# Patient Record
Sex: Female | Born: 1955
Health system: Southern US, Community
[De-identification: ages and names within clinical notes are randomized; demographics above are authoritative.]

## PROBLEM LIST (undated history)

## (undated) DIAGNOSIS — C50919 Malignant neoplasm of unspecified site of unspecified female breast: Secondary | ICD-10-CM

---

## 1999-10-19 ENCOUNTER — Other Ambulatory Visit: Admission: RE | Admit: 1999-10-19 | Discharge: 1999-10-19 | Payer: Self-pay | Admitting: Obstetrics and Gynecology

## 1999-11-27 ENCOUNTER — Other Ambulatory Visit: Admission: RE | Admit: 1999-11-27 | Discharge: 1999-11-27 | Payer: Self-pay | Admitting: Obstetrics and Gynecology

## 1999-11-27 ENCOUNTER — Encounter (INDEPENDENT_AMBULATORY_CARE_PROVIDER_SITE_OTHER): Payer: Self-pay | Admitting: Specialist

## 2000-08-08 ENCOUNTER — Encounter: Payer: Self-pay | Admitting: Gastroenterology

## 2000-08-08 ENCOUNTER — Encounter: Admission: RE | Admit: 2000-08-08 | Discharge: 2000-08-08 | Payer: Self-pay | Admitting: *Deleted

## 2000-10-08 ENCOUNTER — Encounter: Payer: Self-pay | Admitting: Obstetrics and Gynecology

## 2000-10-08 ENCOUNTER — Encounter: Admission: RE | Admit: 2000-10-08 | Discharge: 2000-10-08 | Payer: Self-pay | Admitting: Obstetrics and Gynecology

## 2000-11-04 ENCOUNTER — Other Ambulatory Visit: Admission: RE | Admit: 2000-11-04 | Discharge: 2000-11-04 | Payer: Self-pay | Admitting: Obstetrics and Gynecology

## 2000-12-06 ENCOUNTER — Encounter: Payer: Self-pay | Admitting: Family Medicine

## 2000-12-06 ENCOUNTER — Encounter: Admission: RE | Admit: 2000-12-06 | Discharge: 2000-12-06 | Payer: Self-pay | Admitting: Family Medicine

## 2000-12-25 ENCOUNTER — Ambulatory Visit (HOSPITAL_BASED_OUTPATIENT_CLINIC_OR_DEPARTMENT_OTHER): Admission: RE | Admit: 2000-12-25 | Discharge: 2000-12-25 | Payer: Self-pay | Admitting: Surgery

## 2002-03-23 ENCOUNTER — Encounter: Admission: RE | Admit: 2002-03-23 | Discharge: 2002-03-23 | Payer: Self-pay | Admitting: Obstetrics and Gynecology

## 2002-03-23 ENCOUNTER — Encounter: Payer: Self-pay | Admitting: Obstetrics and Gynecology

## 2002-06-15 ENCOUNTER — Other Ambulatory Visit: Admission: RE | Admit: 2002-06-15 | Discharge: 2002-06-15 | Payer: Self-pay | Admitting: Obstetrics and Gynecology

## 2003-08-25 ENCOUNTER — Other Ambulatory Visit: Admission: RE | Admit: 2003-08-25 | Discharge: 2003-08-25 | Payer: Self-pay | Admitting: Obstetrics and Gynecology

## 2003-12-08 ENCOUNTER — Encounter: Admission: RE | Admit: 2003-12-08 | Discharge: 2003-12-08 | Payer: Self-pay | Admitting: Obstetrics and Gynecology

## 2004-09-27 ENCOUNTER — Other Ambulatory Visit: Admission: RE | Admit: 2004-09-27 | Discharge: 2004-09-27 | Payer: Self-pay | Admitting: Obstetrics and Gynecology

## 2004-12-29 ENCOUNTER — Encounter: Admission: RE | Admit: 2004-12-29 | Discharge: 2004-12-29 | Payer: Self-pay | Admitting: Obstetrics and Gynecology

## 2005-10-15 ENCOUNTER — Other Ambulatory Visit: Admission: RE | Admit: 2005-10-15 | Discharge: 2005-10-15 | Payer: Self-pay | Admitting: Obstetrics and Gynecology

## 2005-12-31 ENCOUNTER — Encounter: Admission: RE | Admit: 2005-12-31 | Discharge: 2005-12-31 | Payer: Self-pay | Admitting: Obstetrics and Gynecology

## 2007-07-04 ENCOUNTER — Encounter: Admission: RE | Admit: 2007-07-04 | Discharge: 2007-07-04 | Payer: Self-pay | Admitting: Obstetrics and Gynecology

## 2007-08-19 ENCOUNTER — Other Ambulatory Visit: Admission: RE | Admit: 2007-08-19 | Discharge: 2007-08-19 | Payer: Self-pay | Admitting: Obstetrics and Gynecology

## 2008-07-13 ENCOUNTER — Encounter: Admission: RE | Admit: 2008-07-13 | Discharge: 2008-07-13 | Payer: Self-pay | Admitting: Obstetrics and Gynecology

## 2008-08-20 ENCOUNTER — Other Ambulatory Visit: Admission: RE | Admit: 2008-08-20 | Discharge: 2008-08-20 | Payer: Self-pay | Admitting: Obstetrics and Gynecology

## 2008-09-17 ENCOUNTER — Encounter: Admission: RE | Admit: 2008-09-17 | Discharge: 2008-09-17 | Payer: Self-pay | Admitting: Family Medicine

## 2008-10-15 ENCOUNTER — Encounter: Admission: RE | Admit: 2008-10-15 | Discharge: 2008-10-15 | Payer: Self-pay | Admitting: General Surgery

## 2009-08-02 ENCOUNTER — Encounter: Admission: RE | Admit: 2009-08-02 | Discharge: 2009-08-02 | Payer: Self-pay | Admitting: Obstetrics and Gynecology

## 2009-10-04 ENCOUNTER — Other Ambulatory Visit: Admission: RE | Admit: 2009-10-04 | Discharge: 2009-10-04 | Payer: Self-pay | Admitting: Obstetrics and Gynecology

## 2010-08-08 ENCOUNTER — Encounter: Admission: RE | Admit: 2010-08-08 | Discharge: 2010-08-08 | Payer: Self-pay | Admitting: Obstetrics and Gynecology

## 2010-10-31 ENCOUNTER — Other Ambulatory Visit
Admission: RE | Admit: 2010-10-31 | Discharge: 2010-10-31 | Payer: Self-pay | Source: Home / Self Care | Admitting: Obstetrics and Gynecology

## 2010-12-10 ENCOUNTER — Encounter: Payer: Self-pay | Admitting: Obstetrics and Gynecology

## 2011-04-06 NOTE — Op Note (Signed)
[place]. [facility]  Patient:    ADRINNE, Sarah Pace                         MRN: [id number] Proc. Date: 12/25/00 Adm. Date:  [id number] Attending:  Charlton Haws CC:         Chales Salmon. Abigail Miyamoto, M.D.  Charolett Bumpers III, M.D.   Operative Report  CCS# [id number]  PREOPERATIVE DIAGNOSIS:  Epigastric hernia.  POSTOPERATIVE DIAGNOSIS:  Epigastric hernia.  OPERATION PERFORMED:  SURGEON:  Currie Paris, M.D.  ANESTHESIA:  INDICATIONS FOR PROCEDURE:  The patient is a 55 year old who has had a symptomatic epigastric hernia and came in for repair.  DESCRIPTION OF PROCEDURE:  The patient was brought to the operating room and after satisfactory IV sedation, was prepped and draped.  The exact spot of the hernia had been marked with the patient standing up while she was still in the holding area before coming to the operating room.  A combination of 1% Xylocaine with epinephrine and 0.5% plain Marcaine was mixed equally and used as a local and the area infiltrated.  A skin incision was made and subcutaneous tissues divided until I encountered some fatty tissue that was fairly separate from the subcutaneous fat and cleaning this off was able to identify that this was epigastric fat protruding through a small fascial defect.  Once I had the fascia cleaned off in all directions around the defect I was able to readily reduce the fatty tissue and the defect itself was under 1 cm in size.  With it reduced, I held it up with two Kocher clamps on each corner and placed a small mesh plug into the defect and closed the defect with two sutures with 0 Prolene.  A circular piece of mesh was then overlaid as an onlay graft and sutured down with sutures of 0 Prolene.  The incision appeared to be dry.  The wound was closed with some 3-0 Vicryl followed by 4-0 Monocryl subcuticular plus Steri-Strips.  The patient tolerated the procedure well. There are no operative  complications.  All counts were correct. DD:  12/25/00 TD:  12/26/00 Job: [id number]

## 2012-06-12 ENCOUNTER — Other Ambulatory Visit: Payer: Self-pay | Admitting: Obstetrics and Gynecology

## 2012-06-12 DIAGNOSIS — Z1231 Encounter for screening mammogram for malignant neoplasm of breast: Secondary | ICD-10-CM

## 2012-06-19 ENCOUNTER — Ambulatory Visit: Payer: Self-pay

## 2012-06-24 ENCOUNTER — Ambulatory Visit
Admission: RE | Admit: 2012-06-24 | Discharge: 2012-06-24 | Disposition: A | Discharge status: Home / Self Care | Payer: 59 | Source: Ambulatory Visit | Attending: Obstetrics and Gynecology | Admitting: Obstetrics and Gynecology

## 2012-06-24 DIAGNOSIS — Z1231 Encounter for screening mammogram for malignant neoplasm of breast: Secondary | ICD-10-CM

## 2012-07-04 ENCOUNTER — Other Ambulatory Visit: Payer: Self-pay | Admitting: Obstetrics and Gynecology

## 2012-07-04 ENCOUNTER — Other Ambulatory Visit (HOSPITAL_COMMUNITY)
Admission: RE | Admit: 2012-07-04 | Discharge: 2012-07-04 | Disposition: A | Discharge status: Home / Self Care | Payer: 59 | Source: Ambulatory Visit | Attending: Obstetrics and Gynecology | Admitting: Obstetrics and Gynecology

## 2012-07-04 DIAGNOSIS — Z01419 Encounter for gynecological examination (general) (routine) without abnormal findings: Secondary | ICD-10-CM | POA: Insufficient documentation

## 2014-03-22 ENCOUNTER — Other Ambulatory Visit: Payer: Self-pay

## 2014-03-22 DIAGNOSIS — Z1231 Encounter for screening mammogram for malignant neoplasm of breast: Secondary | ICD-10-CM

## 2014-03-29 ENCOUNTER — Encounter (INDEPENDENT_AMBULATORY_CARE_PROVIDER_SITE_OTHER): Payer: Self-pay

## 2014-03-29 ENCOUNTER — Ambulatory Visit
Admission: RE | Admit: 2014-03-29 | Discharge: 2014-03-29 | Disposition: A | Discharge status: Home / Self Care | Payer: No Typology Code available for payment source | Source: Ambulatory Visit

## 2014-03-29 DIAGNOSIS — Z1231 Encounter for screening mammogram for malignant neoplasm of breast: Secondary | ICD-10-CM

## 2014-04-06 ENCOUNTER — Other Ambulatory Visit: Payer: Self-pay | Admitting: Obstetrics and Gynecology

## 2014-04-06 ENCOUNTER — Other Ambulatory Visit (HOSPITAL_COMMUNITY)
Admission: RE | Admit: 2014-04-06 | Discharge: 2014-04-06 | Disposition: A | Discharge status: Home / Self Care | Payer: No Typology Code available for payment source | Source: Ambulatory Visit | Attending: Obstetrics and Gynecology | Admitting: Obstetrics and Gynecology

## 2014-04-06 DIAGNOSIS — Z01419 Encounter for gynecological examination (general) (routine) without abnormal findings: Secondary | ICD-10-CM | POA: Insufficient documentation

## 2014-04-06 DIAGNOSIS — Z1151 Encounter for screening for human papillomavirus (HPV): Secondary | ICD-10-CM | POA: Insufficient documentation

## 2015-05-17 ENCOUNTER — Ambulatory Visit (INDEPENDENT_AMBULATORY_CARE_PROVIDER_SITE_OTHER): Payer: No Typology Code available for payment source | Admitting: Podiatry

## 2015-05-17 ENCOUNTER — Ambulatory Visit (INDEPENDENT_AMBULATORY_CARE_PROVIDER_SITE_OTHER): Payer: No Typology Code available for payment source

## 2015-05-17 DIAGNOSIS — M722 Plantar fascial fibromatosis: Secondary | ICD-10-CM | POA: Diagnosis not present

## 2015-05-17 DIAGNOSIS — M779 Enthesopathy, unspecified: Secondary | ICD-10-CM

## 2015-05-17 DIAGNOSIS — R52 Pain, unspecified: Secondary | ICD-10-CM

## 2015-05-17 MED ORDER — DICLOFENAC SODIUM 75 MG PO TBEC: 75.0000 mg | DELAYED_RELEASE_TABLET | Freq: Two times a day (BID) | ORAL | Status: DC

## 2015-05-17 MED ORDER — TRIAMCINOLONE ACETONIDE 10 MG/ML IJ SUSP
10.0000 mg | Freq: Once | INTRAMUSCULAR | Status: AC
  Administered 2015-05-17: 10 mg

## 2015-05-17 NOTE — Patient Instructions (Signed)

## 2015-05-17 NOTE — Progress Notes (Signed)
   Subjective:    Patient ID: DAESIA ZYLKA, female    DOB: 1956-04-24, 59 y.o.   MRN: [id number]  HPI Patient presents heel and ankle pain in L foot. Originally started 4 years ago but has significantly worsened over last couple weeks. Pain in the heel and arch of her foot seems indicative of plantar fasciitis. Ankle area is swollen and patient says she may have bone spurs. Patient also has pain on outside of foot when she walks.   Review of Systems  Constitutional: Negative.   HENT: Negative.   Eyes: Positive for visual disturbance.  Respiratory: Negative.   Cardiovascular: Negative.   Gastrointestinal: Negative.   Endocrine: Negative.   Genitourinary: Negative.   Musculoskeletal: Negative.   Skin: Negative.   Allergic/Immunologic: Negative.   Neurological: Negative.   Hematological: Negative.   Psychiatric/Behavioral: Negative.        Objective:   Physical Exam        Assessment & Plan:

## 2015-05-17 NOTE — Progress Notes (Signed)
Subjective:     Patient ID: Sarah Pace, female   DOB: October 05, 1956, 59 y.o.   MRN: [id number]  HPI patient states my left heel has been killing me and I have had trouble with my ankle also that I had treated several years ago and it's been okay but we'll still bother me at times   Review of Systems  All other systems reviewed and are negative.      Objective:   Physical Exam  Constitutional: She is oriented to person, place, and time.  Cardiovascular: Intact distal pulses.   Musculoskeletal: Normal range of motion.  Neurological: She is oriented to person, place, and time.  Skin: Skin is warm.  Nursing note and vitals reviewed.  neurovascular status intact muscle strength adequate range of motion within normal limits. Patient's noted to have moderate discomfort in the posterior tibial tendon left at the insertion to the navicular but the tendon is strong and is functioning well and has quite a bit of pain in the plantar fascia at the insertion of the tendon into the calcaneus. Digits are well-perfused patient well oriented 3     Assessment:     Acute plantar fasciitis left with moderate chronic posterior tibial tendinitis left    Plan:     H&P and x-rays reviewed. Patient will require at one point Orthotics as her orthotics or 59 years old but at this point were focusing on the acute plantar fasciitis and I injected the plantar fascia 3 mg Kenalog 5 mg Xylocaine and applied fascial brace with instructions on usage

## 2015-05-24 ENCOUNTER — Encounter: Payer: Self-pay | Admitting: Podiatry

## 2015-05-24 ENCOUNTER — Ambulatory Visit (INDEPENDENT_AMBULATORY_CARE_PROVIDER_SITE_OTHER): Payer: No Typology Code available for payment source | Admitting: Podiatry

## 2015-05-24 VITALS — BP 115/69 | HR 64 | Resp 12

## 2015-05-24 DIAGNOSIS — M25472 Effusion, left ankle: Secondary | ICD-10-CM | POA: Diagnosis not present

## 2015-05-24 DIAGNOSIS — M722 Plantar fascial fibromatosis: Secondary | ICD-10-CM

## 2015-05-25 ENCOUNTER — Ambulatory Visit: Payer: No Typology Code available for payment source | Admitting: Podiatry

## 2015-05-25 NOTE — Progress Notes (Signed)
Subjective:     Patient ID: Sarah Pace, female   DOB: Jan 18, 1956, 59 y.o.   MRN: [id number]  HPI patient states my heel is getting quite a bit better but still sore at the end of the day and I know I need new orthotics as they are no longer holding my arch up properly   Review of Systems     Objective:   Physical Exam Neurovascular status intact with significant diminishment of discomfort plantar aspect left heel at the insertional point tendon the calcaneus with moderate depression of the arch noted upon weightbearing    Assessment:     Plantar fasciitis still present left with improvement    Plan:     Reviewed continued physical therapy anti-inflammatory and supportive shoe gear usage. Scanned for new orthotics to replace 59-year-old orthotics and explained activities that I would like her to do over this next several weeks.

## 2015-06-15 ENCOUNTER — Ambulatory Visit: Payer: No Typology Code available for payment source | Admitting: *Deleted

## 2015-06-15 DIAGNOSIS — M722 Plantar fascial fibromatosis: Secondary | ICD-10-CM

## 2015-06-15 NOTE — Progress Notes (Signed)
Patient ID: Sarah Pace, female   DOB: [birth year], 59 y.o.   MRN: [id number] Patient presents for orthotic pick up.  Verbal and written break in and wear instructions given.  Patient will follow up in 4 weeks if symptoms worsen or fail to improve.

## 2015-06-15 NOTE — Patient Instructions (Signed)

## 2015-06-22 ENCOUNTER — Ambulatory Visit (INDEPENDENT_AMBULATORY_CARE_PROVIDER_SITE_OTHER): Payer: No Typology Code available for payment source | Admitting: Podiatry

## 2015-06-22 ENCOUNTER — Encounter: Payer: Self-pay | Admitting: Podiatry

## 2015-06-22 VITALS — BP 130/80 | HR 58 | Resp 15

## 2015-06-22 DIAGNOSIS — M722 Plantar fascial fibromatosis: Secondary | ICD-10-CM | POA: Diagnosis not present

## 2015-06-22 MED ORDER — TRIAMCINOLONE ACETONIDE 10 MG/ML IJ SUSP
10.0000 mg | Freq: Once | INTRAMUSCULAR | Status: AC
  Administered 2015-06-22: 10 mg

## 2015-06-23 NOTE — Progress Notes (Signed)
Subjective:     Patient ID: Sarah Pace, female   DOB: Jun 26, 1956, 59 y.o.   MRN: [id number]  HPI patient states the inside of her heel seems to be improving but there's been discomfort in the outside of the heel and center that's moderate in intensity   Review of Systems     Objective:   Physical Exam Neurovascular status intact muscle strength was adequate with discomfort that is more in the lateral and central band of the plantar fascia with improvement of a significant nature in the medial band of the plantar fascia    Assessment:     Plantar fasciitis left lateral band moderate into the central band    Plan:     Reviewed condition and at this time did a careful lateral injection 3 mg Kenalog 5 mg Xylocaine and instructed on physical therapy and continued orthotic usage and reappoint as needed

## 2016-05-28 ENCOUNTER — Other Ambulatory Visit: Payer: Self-pay | Admitting: Obstetrics and Gynecology

## 2016-05-28 DIAGNOSIS — Z1231 Encounter for screening mammogram for malignant neoplasm of breast: Secondary | ICD-10-CM

## 2016-06-01 ENCOUNTER — Ambulatory Visit
Admission: RE | Admit: 2016-06-01 | Discharge: 2016-06-01 | Disposition: A | Discharge status: Home / Self Care | Payer: 59 | Source: Ambulatory Visit | Attending: Obstetrics and Gynecology | Admitting: Obstetrics and Gynecology

## 2016-06-01 DIAGNOSIS — Z1231 Encounter for screening mammogram for malignant neoplasm of breast: Secondary | ICD-10-CM

## 2017-02-21 DIAGNOSIS — H40012 Open angle with borderline findings, low risk, left eye: Secondary | ICD-10-CM | POA: Diagnosis not present

## 2017-02-21 DIAGNOSIS — H40011 Open angle with borderline findings, low risk, right eye: Secondary | ICD-10-CM | POA: Diagnosis not present

## 2017-02-21 DIAGNOSIS — H5340 Unspecified visual field defects: Secondary | ICD-10-CM | POA: Diagnosis not present

## 2017-04-17 DIAGNOSIS — H401131 Primary open-angle glaucoma, bilateral, mild stage: Secondary | ICD-10-CM | POA: Diagnosis not present

## 2017-05-01 ENCOUNTER — Ambulatory Visit (INDEPENDENT_AMBULATORY_CARE_PROVIDER_SITE_OTHER): Payer: 59 | Admitting: Podiatry

## 2017-05-01 ENCOUNTER — Ambulatory Visit (INDEPENDENT_AMBULATORY_CARE_PROVIDER_SITE_OTHER): Payer: 59

## 2017-05-01 ENCOUNTER — Encounter: Payer: Self-pay | Admitting: Podiatry

## 2017-05-01 DIAGNOSIS — M79672 Pain in left foot: Secondary | ICD-10-CM

## 2017-05-01 DIAGNOSIS — M7752 Other enthesopathy of left foot: Secondary | ICD-10-CM

## 2017-05-01 DIAGNOSIS — M779 Enthesopathy, unspecified: Secondary | ICD-10-CM

## 2017-05-01 DIAGNOSIS — M7750 Other enthesopathy of unspecified foot: Secondary | ICD-10-CM

## 2017-05-01 MED ORDER — DICLOFENAC SODIUM 75 MG PO TBEC: 75.0000 mg | DELAYED_RELEASE_TABLET | Freq: Two times a day (BID) | ORAL | 2 refills | Status: DC

## 2017-05-01 MED ORDER — TRIAMCINOLONE ACETONIDE 10 MG/ML IJ SUSP
10.0000 mg | Freq: Once | INTRAMUSCULAR | Status: AC
  Administered 2017-05-01: 10 mg

## 2017-05-02 NOTE — Progress Notes (Signed)
Subjective:    Patient ID: Sarah Pace, female   DOB: 61 y.o.   MRN: [id number]   HPI patient states that she is started develop a lot of pain on the outside of her left foot and she does not remember specific injury    ROS      Objective:  Physical Exam neurovascular status is found to be intact with patient found have inflammatory changes in the lateral side of the fifth metatarsal left with fluid buildup around the insertion into the peroneal fifth metatarsal base     Assessment:    Acute tendinitis left fifth metatarsal     Plan:    X-rays reviewed and at this time I injected the left tendinous insertion 3 mg Kenalog 5 mill grams Xylocaine advised on ice therapy and reduced activity. Also we'll use a brace for medial lateral to lift the lateral side of the foot and will be seen back again in the next several weeks  X-rays indicate that there is no indication of fracture or bone pathology

## 2017-05-15 ENCOUNTER — Ambulatory Visit: Payer: 59 | Admitting: Podiatry

## 2017-06-12 ENCOUNTER — Ambulatory Visit (INDEPENDENT_AMBULATORY_CARE_PROVIDER_SITE_OTHER): Payer: 59 | Admitting: Podiatry

## 2017-06-12 DIAGNOSIS — M7750 Other enthesopathy of unspecified foot: Secondary | ICD-10-CM

## 2017-06-12 NOTE — Progress Notes (Signed)
Subjective:    Patient ID: Sarah Pace, female   DOB: 61 y.o.   MRN: [id number]   HPI patient presents with pain again in the outside left foot after becoming more active    ROS      Objective:  Physical Exam neurovascular status intact with patient's left lateral foot set tender around the fifth metatarsal insertion of a mild to moderate nature     Assessment:    Reoccurrence of tendinitis left     Plan:    Discussed different treatment options and today careful sheath injection was delayed and may need to be done in future but we'll try immobilization ice therapy anti-inflammatories and reduced activity. If symptoms persist may need to consider MRI

## 2017-06-14 DIAGNOSIS — M2550 Pain in unspecified joint: Secondary | ICD-10-CM | POA: Diagnosis not present

## 2017-06-14 DIAGNOSIS — H6121 Impacted cerumen, right ear: Secondary | ICD-10-CM | POA: Diagnosis not present

## 2017-06-14 DIAGNOSIS — H6993 Unspecified Eustachian tube disorder, bilateral: Secondary | ICD-10-CM | POA: Diagnosis not present

## 2017-08-02 DIAGNOSIS — M79672 Pain in left foot: Secondary | ICD-10-CM | POA: Diagnosis not present

## 2017-08-10 DIAGNOSIS — M25572 Pain in left ankle and joints of left foot: Secondary | ICD-10-CM | POA: Diagnosis not present

## 2017-08-14 ENCOUNTER — Ambulatory Visit: Payer: 59 | Admitting: Podiatry

## 2017-08-19 DIAGNOSIS — M79672 Pain in left foot: Secondary | ICD-10-CM | POA: Diagnosis not present

## 2017-08-30 DIAGNOSIS — H401131 Primary open-angle glaucoma, bilateral, mild stage: Secondary | ICD-10-CM | POA: Diagnosis not present

## 2017-11-20 ENCOUNTER — Other Ambulatory Visit: Payer: Self-pay | Admitting: Obstetrics and Gynecology

## 2017-11-20 DIAGNOSIS — Z139 Encounter for screening, unspecified: Secondary | ICD-10-CM

## 2017-12-25 ENCOUNTER — Ambulatory Visit: Payer: 59

## 2018-01-06 ENCOUNTER — Ambulatory Visit
Admission: RE | Admit: 2018-01-06 | Discharge: 2018-01-06 | Disposition: A | Discharge status: Home / Self Care | Payer: 59 | Source: Ambulatory Visit | Attending: Obstetrics and Gynecology | Admitting: Obstetrics and Gynecology

## 2018-01-06 ENCOUNTER — Encounter: Payer: Self-pay | Admitting: Radiology

## 2018-01-06 DIAGNOSIS — Z1231 Encounter for screening mammogram for malignant neoplasm of breast: Secondary | ICD-10-CM | POA: Diagnosis not present

## 2018-01-06 DIAGNOSIS — Z139 Encounter for screening, unspecified: Secondary | ICD-10-CM

## 2018-02-10 DIAGNOSIS — Z0000 Encounter for general adult medical examination without abnormal findings: Secondary | ICD-10-CM | POA: Diagnosis not present

## 2018-02-10 DIAGNOSIS — R7303 Prediabetes: Secondary | ICD-10-CM | POA: Diagnosis not present

## 2018-02-10 DIAGNOSIS — Z1159 Encounter for screening for other viral diseases: Secondary | ICD-10-CM | POA: Diagnosis not present

## 2018-02-10 DIAGNOSIS — Z13220 Encounter for screening for lipoid disorders: Secondary | ICD-10-CM | POA: Diagnosis not present

## 2018-03-28 DIAGNOSIS — H401131 Primary open-angle glaucoma, bilateral, mild stage: Secondary | ICD-10-CM | POA: Diagnosis not present

## 2018-04-08 ENCOUNTER — Other Ambulatory Visit: Payer: Self-pay | Admitting: Obstetrics and Gynecology

## 2018-04-08 ENCOUNTER — Other Ambulatory Visit (HOSPITAL_COMMUNITY)
Admission: RE | Admit: 2018-04-08 | Discharge: 2018-04-08 | Disposition: A | Discharge status: Home / Self Care | Payer: 59 | Source: Ambulatory Visit | Attending: Obstetrics and Gynecology | Admitting: Obstetrics and Gynecology

## 2018-04-08 DIAGNOSIS — Z01411 Encounter for gynecological examination (general) (routine) with abnormal findings: Secondary | ICD-10-CM | POA: Insufficient documentation

## 2018-04-10 LAB — CYTOLOGY - PAP
Diagnosis: NEGATIVE
HPV (WINDOPATH): NOT DETECTED

## 2018-04-30 DIAGNOSIS — K623 Rectal prolapse: Secondary | ICD-10-CM | POA: Diagnosis not present

## 2018-05-14 DIAGNOSIS — K621 Rectal polyp: Secondary | ICD-10-CM | POA: Diagnosis not present

## 2018-06-19 DIAGNOSIS — D126 Benign neoplasm of colon, unspecified: Secondary | ICD-10-CM | POA: Diagnosis not present

## 2018-06-19 DIAGNOSIS — K635 Polyp of colon: Secondary | ICD-10-CM | POA: Diagnosis not present

## 2018-06-19 DIAGNOSIS — D122 Benign neoplasm of ascending colon: Secondary | ICD-10-CM | POA: Diagnosis not present

## 2018-06-19 DIAGNOSIS — K621 Rectal polyp: Secondary | ICD-10-CM | POA: Diagnosis not present

## 2018-06-19 DIAGNOSIS — Z800 Family history of malignant neoplasm of digestive organs: Secondary | ICD-10-CM | POA: Diagnosis not present

## 2018-06-19 DIAGNOSIS — D129 Benign neoplasm of anus and anal canal: Secondary | ICD-10-CM | POA: Diagnosis not present

## 2018-06-19 DIAGNOSIS — Z1211 Encounter for screening for malignant neoplasm of colon: Secondary | ICD-10-CM | POA: Diagnosis not present

## 2018-06-30 DIAGNOSIS — H401131 Primary open-angle glaucoma, bilateral, mild stage: Secondary | ICD-10-CM | POA: Diagnosis not present

## 2018-07-08 DIAGNOSIS — K642 Third degree hemorrhoids: Secondary | ICD-10-CM | POA: Diagnosis not present

## 2018-07-08 DIAGNOSIS — K629 Disease of anus and rectum, unspecified: Secondary | ICD-10-CM | POA: Diagnosis not present

## 2018-07-14 DIAGNOSIS — R05 Cough: Secondary | ICD-10-CM | POA: Diagnosis not present

## 2018-08-01 DIAGNOSIS — K642 Third degree hemorrhoids: Secondary | ICD-10-CM | POA: Diagnosis not present

## 2018-08-03 DIAGNOSIS — J069 Acute upper respiratory infection, unspecified: Secondary | ICD-10-CM | POA: Diagnosis not present

## 2018-08-03 DIAGNOSIS — J029 Acute pharyngitis, unspecified: Secondary | ICD-10-CM | POA: Diagnosis not present

## 2018-08-07 DIAGNOSIS — S1011XA Abrasion of throat, initial encounter: Secondary | ICD-10-CM | POA: Diagnosis not present

## 2018-08-07 DIAGNOSIS — J029 Acute pharyngitis, unspecified: Secondary | ICD-10-CM | POA: Diagnosis not present

## 2018-08-07 DIAGNOSIS — R490 Dysphonia: Secondary | ICD-10-CM | POA: Diagnosis not present

## 2018-08-07 DIAGNOSIS — J343 Hypertrophy of nasal turbinates: Secondary | ICD-10-CM | POA: Diagnosis not present

## 2018-10-04 DIAGNOSIS — H109 Unspecified conjunctivitis: Secondary | ICD-10-CM | POA: Diagnosis not present

## 2018-12-01 DIAGNOSIS — H10413 Chronic giant papillary conjunctivitis, bilateral: Secondary | ICD-10-CM | POA: Diagnosis not present

## 2019-01-05 DIAGNOSIS — H401131 Primary open-angle glaucoma, bilateral, mild stage: Secondary | ICD-10-CM | POA: Diagnosis not present

## 2019-01-12 DIAGNOSIS — M898X1 Other specified disorders of bone, shoulder: Secondary | ICD-10-CM | POA: Diagnosis not present

## 2019-01-12 DIAGNOSIS — M7051 Other bursitis of knee, right knee: Secondary | ICD-10-CM | POA: Diagnosis not present

## 2019-01-12 DIAGNOSIS — M7052 Other bursitis of knee, left knee: Secondary | ICD-10-CM | POA: Diagnosis not present

## 2019-01-14 DIAGNOSIS — N3001 Acute cystitis with hematuria: Secondary | ICD-10-CM | POA: Diagnosis not present

## 2019-01-14 DIAGNOSIS — R300 Dysuria: Secondary | ICD-10-CM | POA: Diagnosis not present

## 2019-01-19 ENCOUNTER — Other Ambulatory Visit: Payer: Self-pay | Admitting: Obstetrics and Gynecology

## 2019-01-19 DIAGNOSIS — Z1231 Encounter for screening mammogram for malignant neoplasm of breast: Secondary | ICD-10-CM

## 2019-01-27 DIAGNOSIS — J4520 Mild intermittent asthma, uncomplicated: Secondary | ICD-10-CM | POA: Diagnosis not present

## 2019-01-27 DIAGNOSIS — J3089 Other allergic rhinitis: Secondary | ICD-10-CM | POA: Diagnosis not present

## 2019-01-27 DIAGNOSIS — J301 Allergic rhinitis due to pollen: Secondary | ICD-10-CM | POA: Diagnosis not present

## 2019-01-27 DIAGNOSIS — J3081 Allergic rhinitis due to animal (cat) (dog) hair and dander: Secondary | ICD-10-CM | POA: Diagnosis not present

## 2019-02-04 DIAGNOSIS — H401131 Primary open-angle glaucoma, bilateral, mild stage: Secondary | ICD-10-CM | POA: Diagnosis not present

## 2019-02-13 ENCOUNTER — Ambulatory Visit: Payer: 59

## 2020-02-12 ENCOUNTER — Ambulatory Visit: Payer: 59 | Attending: Internal Medicine

## 2020-02-12 DIAGNOSIS — Z23 Encounter for immunization: Secondary | ICD-10-CM

## 2020-02-12 NOTE — Progress Notes (Signed)
   Covid-19 Vaccination Clinic  Name:  Sarah Pace    MRN: [id number] DOB: 06-28-1956  02/12/2020  Ms. Cisar was observed post Covid-19 immunization for 30 minutes based on pre-vaccination screening without incident. She was provided with Vaccine Information Sheet and instruction to access the V-Safe system.   Ms. Menges was instructed to call 911 with any severe reactions post vaccine: Marland Kitchen Difficulty breathing  . Swelling of face and throat  . A fast heartbeat  . A bad rash all over body  . Dizziness and weakness   Immunizations Administered    Name Date Dose VIS Date Route   Pfizer COVID-19 Vaccine 02/12/2020  1:17 PM 0.3 mL 10/30/2019 Intramuscular   Manufacturer: [place]   Lot: [id number]   [place]: [id number]

## 2020-03-08 ENCOUNTER — Ambulatory Visit: Payer: 59

## 2020-03-15 ENCOUNTER — Ambulatory Visit: Payer: 59

## 2021-07-21 ENCOUNTER — Other Ambulatory Visit: Payer: Self-pay | Admitting: Obstetrics and Gynecology

## 2021-07-21 DIAGNOSIS — Z1231 Encounter for screening mammogram for malignant neoplasm of breast: Secondary | ICD-10-CM

## 2021-09-08 ENCOUNTER — Ambulatory Visit: Payer: 59

## 2021-09-22 ENCOUNTER — Ambulatory Visit
Admission: RE | Admit: 2021-09-22 | Discharge: 2021-09-22 | Disposition: A | Discharge status: Home / Self Care | Payer: 59 | Source: Ambulatory Visit | Attending: Obstetrics and Gynecology | Admitting: Obstetrics and Gynecology

## 2021-09-22 ENCOUNTER — Other Ambulatory Visit: Payer: Self-pay

## 2021-09-22 DIAGNOSIS — Z1231 Encounter for screening mammogram for malignant neoplasm of breast: Secondary | ICD-10-CM

## 2021-12-18 DIAGNOSIS — J019 Acute sinusitis, unspecified: Secondary | ICD-10-CM | POA: Diagnosis not present

## 2021-12-18 DIAGNOSIS — R0981 Nasal congestion: Secondary | ICD-10-CM | POA: Diagnosis not present

## 2021-12-18 DIAGNOSIS — J029 Acute pharyngitis, unspecified: Secondary | ICD-10-CM | POA: Diagnosis not present

## 2022-04-06 DIAGNOSIS — H6123 Impacted cerumen, bilateral: Secondary | ICD-10-CM | POA: Diagnosis not present

## 2022-10-02 DIAGNOSIS — H401131 Primary open-angle glaucoma, bilateral, mild stage: Secondary | ICD-10-CM | POA: Diagnosis not present

## 2022-12-06 ENCOUNTER — Other Ambulatory Visit: Payer: Self-pay | Admitting: Family Medicine

## 2022-12-06 DIAGNOSIS — Z1231 Encounter for screening mammogram for malignant neoplasm of breast: Secondary | ICD-10-CM

## 2022-12-13 DIAGNOSIS — Z79899 Other long term (current) drug therapy: Secondary | ICD-10-CM | POA: Diagnosis not present

## 2022-12-19 DIAGNOSIS — U071 COVID-19: Secondary | ICD-10-CM | POA: Diagnosis not present

## 2023-01-08 DIAGNOSIS — Z0000 Encounter for general adult medical examination without abnormal findings: Secondary | ICD-10-CM | POA: Diagnosis not present

## 2023-01-08 DIAGNOSIS — E559 Vitamin D deficiency, unspecified: Secondary | ICD-10-CM | POA: Diagnosis not present

## 2023-01-08 DIAGNOSIS — Z13220 Encounter for screening for lipoid disorders: Secondary | ICD-10-CM | POA: Diagnosis not present

## 2023-01-08 DIAGNOSIS — R7303 Prediabetes: Secondary | ICD-10-CM | POA: Diagnosis not present

## 2023-01-08 DIAGNOSIS — M545 Low back pain, unspecified: Secondary | ICD-10-CM | POA: Diagnosis not present

## 2023-01-08 DIAGNOSIS — M1990 Unspecified osteoarthritis, unspecified site: Secondary | ICD-10-CM | POA: Diagnosis not present

## 2023-01-25 ENCOUNTER — Other Ambulatory Visit (HOSPITAL_COMMUNITY)
Admission: RE | Admit: 2023-01-25 | Discharge: 2023-01-25 | Disposition: A | Discharge status: Home / Self Care | Payer: BC Managed Care – PPO | Source: Ambulatory Visit | Attending: Obstetrics and Gynecology | Admitting: Obstetrics and Gynecology

## 2023-01-25 ENCOUNTER — Other Ambulatory Visit: Payer: Self-pay | Admitting: Obstetrics and Gynecology

## 2023-01-25 ENCOUNTER — Ambulatory Visit
Admission: RE | Admit: 2023-01-25 | Discharge: 2023-01-25 | Disposition: A | Discharge status: Home / Self Care | Payer: BC Managed Care – PPO | Source: Ambulatory Visit | Attending: Family Medicine | Admitting: Family Medicine

## 2023-01-25 DIAGNOSIS — Z01419 Encounter for gynecological examination (general) (routine) without abnormal findings: Secondary | ICD-10-CM | POA: Diagnosis not present

## 2023-01-25 DIAGNOSIS — Z1231 Encounter for screening mammogram for malignant neoplasm of breast: Secondary | ICD-10-CM | POA: Diagnosis not present

## 2023-01-29 LAB — CYTOLOGY - PAP
Comment: NEGATIVE
Diagnosis: NEGATIVE
High risk HPV: NEGATIVE

## 2023-01-30 ENCOUNTER — Other Ambulatory Visit: Payer: Self-pay | Admitting: Family Medicine

## 2023-01-30 DIAGNOSIS — R928 Other abnormal and inconclusive findings on diagnostic imaging of breast: Secondary | ICD-10-CM

## 2023-02-08 DIAGNOSIS — D235 Other benign neoplasm of skin of trunk: Secondary | ICD-10-CM | POA: Diagnosis not present

## 2023-02-08 HISTORY — PX: BREAST BIOPSY: SHX20

## 2023-02-15 ENCOUNTER — Ambulatory Visit
Admission: RE | Admit: 2023-02-15 | Discharge: 2023-02-15 | Disposition: A | Discharge status: Home / Self Care | Payer: BC Managed Care – PPO | Source: Ambulatory Visit | Attending: Family Medicine | Admitting: Family Medicine

## 2023-02-15 ENCOUNTER — Other Ambulatory Visit: Payer: Self-pay | Admitting: Family Medicine

## 2023-02-15 DIAGNOSIS — R921 Mammographic calcification found on diagnostic imaging of breast: Secondary | ICD-10-CM

## 2023-02-15 DIAGNOSIS — R928 Other abnormal and inconclusive findings on diagnostic imaging of breast: Secondary | ICD-10-CM

## 2023-02-21 DIAGNOSIS — Z86010 Personal history of colonic polyps: Secondary | ICD-10-CM | POA: Diagnosis not present

## 2023-02-21 DIAGNOSIS — Z800 Family history of malignant neoplasm of digestive organs: Secondary | ICD-10-CM | POA: Diagnosis not present

## 2023-03-07 ENCOUNTER — Ambulatory Visit
Admission: RE | Admit: 2023-03-07 | Discharge: 2023-03-07 | Disposition: A | Discharge status: Home / Self Care | Payer: BC Managed Care – PPO | Source: Ambulatory Visit | Attending: Family Medicine | Admitting: Family Medicine

## 2023-03-07 DIAGNOSIS — R921 Mammographic calcification found on diagnostic imaging of breast: Secondary | ICD-10-CM

## 2023-03-07 HISTORY — PX: BREAST BIOPSY: SHX20

## 2023-03-12 DIAGNOSIS — L089 Local infection of the skin and subcutaneous tissue, unspecified: Secondary | ICD-10-CM | POA: Diagnosis not present

## 2023-03-27 ENCOUNTER — Ambulatory Visit: Payer: Self-pay | Admitting: General Surgery

## 2023-03-27 DIAGNOSIS — N6092 Unspecified benign mammary dysplasia of left breast: Secondary | ICD-10-CM | POA: Diagnosis not present

## 2023-03-27 MED ORDER — KETOROLAC TROMETHAMINE 15 MG/ML IJ SOLN: 15.0000 mg | Freq: Once | INTRAMUSCULAR | Status: AC

## 2023-04-01 ENCOUNTER — Other Ambulatory Visit: Payer: Self-pay | Admitting: General Surgery

## 2023-04-01 DIAGNOSIS — N6092 Unspecified benign mammary dysplasia of left breast: Secondary | ICD-10-CM

## 2023-04-11 ENCOUNTER — Other Ambulatory Visit: Payer: Self-pay

## 2023-04-11 ENCOUNTER — Encounter (HOSPITAL_BASED_OUTPATIENT_CLINIC_OR_DEPARTMENT_OTHER): Payer: Self-pay | Admitting: General Surgery

## 2023-04-18 ENCOUNTER — Ambulatory Visit
Admission: RE | Admit: 2023-04-18 | Discharge: 2023-04-18 | Disposition: A | Discharge status: Home / Self Care | Payer: BC Managed Care – PPO | Source: Ambulatory Visit | Attending: General Surgery | Admitting: General Surgery

## 2023-04-18 DIAGNOSIS — R921 Mammographic calcification found on diagnostic imaging of breast: Secondary | ICD-10-CM | POA: Diagnosis not present

## 2023-04-18 DIAGNOSIS — N6092 Unspecified benign mammary dysplasia of left breast: Secondary | ICD-10-CM | POA: Diagnosis not present

## 2023-04-18 HISTORY — PX: BREAST BIOPSY: SHX20

## 2023-04-18 MED ORDER — CHLORHEXIDINE GLUCONATE CLOTH 2 % EX PADS: 6.0000 | MEDICATED_PAD | Freq: Once | CUTANEOUS | Status: DC

## 2023-04-18 NOTE — Progress Notes (Signed)

## 2023-04-19 ENCOUNTER — Ambulatory Visit (HOSPITAL_BASED_OUTPATIENT_CLINIC_OR_DEPARTMENT_OTHER): Payer: BC Managed Care – PPO | Admitting: Anesthesiology

## 2023-04-19 ENCOUNTER — Encounter (HOSPITAL_BASED_OUTPATIENT_CLINIC_OR_DEPARTMENT_OTHER): Payer: Self-pay | Admitting: General Surgery

## 2023-04-19 ENCOUNTER — Ambulatory Visit
Admission: RE | Admit: 2023-04-19 | Discharge: 2023-04-19 | Disposition: A | Discharge status: Home / Self Care | Payer: BC Managed Care – PPO | Source: Ambulatory Visit | Attending: General Surgery | Admitting: General Surgery

## 2023-04-19 ENCOUNTER — Other Ambulatory Visit: Payer: Self-pay

## 2023-04-19 ENCOUNTER — Encounter (HOSPITAL_BASED_OUTPATIENT_CLINIC_OR_DEPARTMENT_OTHER)
Admission: RE | Disposition: A | Discharge status: Home / Self Care | Payer: Self-pay | Source: Home / Self Care | Attending: General Surgery

## 2023-04-19 ENCOUNTER — Ambulatory Visit (HOSPITAL_BASED_OUTPATIENT_CLINIC_OR_DEPARTMENT_OTHER)
Admission: RE | Admit: 2023-04-19 | Discharge: 2023-04-19 | Disposition: A | Discharge status: Home / Self Care | Payer: BC Managed Care – PPO | Attending: General Surgery | Admitting: General Surgery

## 2023-04-19 DIAGNOSIS — N6092 Unspecified benign mammary dysplasia of left breast: Secondary | ICD-10-CM

## 2023-04-19 DIAGNOSIS — C50919 Malignant neoplasm of unspecified site of unspecified female breast: Secondary | ICD-10-CM

## 2023-04-19 DIAGNOSIS — D0512 Intraductal carcinoma in situ of left breast: Secondary | ICD-10-CM | POA: Insufficient documentation

## 2023-04-19 DIAGNOSIS — Z170 Estrogen receptor positive status [ER+]: Secondary | ICD-10-CM | POA: Insufficient documentation

## 2023-04-19 DIAGNOSIS — Z9889 Other specified postprocedural states: Secondary | ICD-10-CM | POA: Diagnosis not present

## 2023-04-19 DIAGNOSIS — Z01818 Encounter for other preprocedural examination: Secondary | ICD-10-CM

## 2023-04-19 HISTORY — DX: Malignant neoplasm of unspecified site of unspecified female breast: C50.919

## 2023-04-19 HISTORY — PX: BREAST LUMPECTOMY WITH RADIOACTIVE SEED LOCALIZATION: SHX6424

## 2023-04-19 SURGERY — BREAST LUMPECTOMY WITH RADIOACTIVE SEED LOCALIZATION
Anesthesia: General | Site: Breast | Laterality: Left

## 2023-04-19 MED ORDER — CEFAZOLIN SODIUM-DEXTROSE 2-4 GM/100ML-% IV SOLN
2.0000 g | INTRAVENOUS | Status: AC
  Administered 2023-04-19: 2 g via INTRAVENOUS

## 2023-04-19 MED ORDER — OXYCODONE HCL 5 MG PO TABS: 5.0000 mg | ORAL_TABLET | Freq: Once | ORAL | Status: DC | PRN

## 2023-04-19 MED ORDER — ACETAMINOPHEN 500 MG PO TABS
ORAL_TABLET | ORAL | Status: AC
  Filled 2023-04-19: qty 2

## 2023-04-19 MED ORDER — ACETAMINOPHEN 500 MG PO TABS
1000.0000 mg | ORAL_TABLET | ORAL | Status: AC
  Administered 2023-04-19: 1000 mg via ORAL

## 2023-04-19 MED ORDER — PROPOFOL 10 MG/ML IV BOLUS
INTRAVENOUS | Status: DC | PRN
  Administered 2023-04-19: 150 mg via INTRAVENOUS

## 2023-04-19 MED ORDER — AMISULPRIDE (ANTIEMETIC) 5 MG/2ML IV SOLN: 10.0000 mg | Freq: Once | INTRAVENOUS | Status: DC | PRN

## 2023-04-19 MED ORDER — CEFAZOLIN SODIUM-DEXTROSE 2-4 GM/100ML-% IV SOLN
INTRAVENOUS | Status: AC
  Filled 2023-04-19: qty 100

## 2023-04-19 MED ORDER — EPHEDRINE 5 MG/ML INJ
INTRAVENOUS | Status: AC
  Filled 2023-04-19: qty 5

## 2023-04-19 MED ORDER — EPHEDRINE SULFATE-NACL 50-0.9 MG/10ML-% IV SOSY
PREFILLED_SYRINGE | INTRAVENOUS | Status: DC | PRN
  Administered 2023-04-19 (×2): 10 mg via INTRAVENOUS

## 2023-04-19 MED ORDER — MIDAZOLAM HCL 5 MG/5ML IJ SOLN
INTRAMUSCULAR | Status: DC | PRN
  Administered 2023-04-19: 2 mg via INTRAVENOUS

## 2023-04-19 MED ORDER — FENTANYL CITRATE (PF) 250 MCG/5ML IJ SOLN
INTRAMUSCULAR | Status: DC | PRN
  Administered 2023-04-19: 50 ug via INTRAVENOUS

## 2023-04-19 MED ORDER — GABAPENTIN 300 MG PO CAPS
300.0000 mg | ORAL_CAPSULE | ORAL | Status: AC
  Administered 2023-04-19: 300 mg via ORAL

## 2023-04-19 MED ORDER — ONDANSETRON HCL 4 MG/2ML IJ SOLN
INTRAMUSCULAR | Status: DC | PRN
  Administered 2023-04-19: 4 mg via INTRAVENOUS

## 2023-04-19 MED ORDER — LACTATED RINGERS IV SOLN: INTRAVENOUS | Status: DC

## 2023-04-19 MED ORDER — GABAPENTIN 300 MG PO CAPS
ORAL_CAPSULE | ORAL | Status: AC
  Filled 2023-04-19: qty 1

## 2023-04-19 MED ORDER — BUPIVACAINE-EPINEPHRINE (PF) 0.25% -1:200000 IJ SOLN
INTRAMUSCULAR | Status: DC | PRN
  Administered 2023-04-19: 20 mL

## 2023-04-19 MED ORDER — MIDAZOLAM HCL 2 MG/2ML IJ SOLN
INTRAMUSCULAR | Status: AC
  Filled 2023-04-19: qty 2

## 2023-04-19 MED ORDER — LIDOCAINE 2% (20 MG/ML) 5 ML SYRINGE
INTRAMUSCULAR | Status: DC | PRN
  Administered 2023-04-19: 80 mg via INTRAVENOUS

## 2023-04-19 MED ORDER — OXYCODONE HCL 5 MG/5ML PO SOLN: 5.0000 mg | Freq: Once | ORAL | Status: DC | PRN

## 2023-04-19 MED ORDER — PROPOFOL 10 MG/ML IV BOLUS
INTRAVENOUS | Status: AC
  Filled 2023-04-19: qty 20

## 2023-04-19 MED ORDER — EPHEDRINE 5 MG/ML INJ
INTRAVENOUS | Status: AC
  Filled 2023-04-19: qty 10

## 2023-04-19 MED ORDER — FENTANYL CITRATE (PF) 100 MCG/2ML IJ SOLN: 25.0000 ug | INTRAMUSCULAR | Status: DC | PRN

## 2023-04-19 MED ORDER — FENTANYL CITRATE (PF) 100 MCG/2ML IJ SOLN
INTRAMUSCULAR | Status: AC
  Filled 2023-04-19: qty 2

## 2023-04-19 MED ORDER — LACTATED RINGERS IV SOLN: INTRAVENOUS | Status: DC | PRN

## 2023-04-19 MED ORDER — OXYCODONE HCL 5 MG PO TABS: 5.0000 mg | ORAL_TABLET | Freq: Four times a day (QID) | ORAL | 0 refills | Status: DC | PRN

## 2023-04-19 MED ORDER — PHENYLEPHRINE 80 MCG/ML (10ML) SYRINGE FOR IV PUSH (FOR BLOOD PRESSURE SUPPORT)
PREFILLED_SYRINGE | INTRAVENOUS | Status: AC
  Filled 2023-04-19: qty 20

## 2023-04-19 MED ORDER — DEXAMETHASONE SODIUM PHOSPHATE 10 MG/ML IJ SOLN
INTRAMUSCULAR | Status: DC | PRN
  Administered 2023-04-19: 5 mg via INTRAVENOUS

## 2023-04-19 SURGICAL SUPPLY — 41 items
ADH SKN CLS APL DERMABOND .7 (GAUZE/BANDAGES/DRESSINGS) ×1
APL PRP STRL LF DISP 70% ISPRP (MISCELLANEOUS) ×1
APPLIER CLIP 9.375 MED OPEN (MISCELLANEOUS)
APR CLP MED 9.3 20 MLT OPN (MISCELLANEOUS)
BLADE SURG 15 STRL LF DISP TIS (BLADE) ×1 IMPLANT
BLADE SURG 15 STRL SS (BLADE) ×1
CANISTER SUC SOCK COL 7IN (MISCELLANEOUS) ×1 IMPLANT
CANISTER SUCT 1200ML W/VALVE (MISCELLANEOUS) ×1 IMPLANT
CHLORAPREP W/TINT 26 (MISCELLANEOUS) ×1 IMPLANT
CLIP APPLIE 9.375 MED OPEN (MISCELLANEOUS) IMPLANT
COVER BACK TABLE 60X90IN (DRAPES) ×1 IMPLANT
COVER MAYO STAND STRL (DRAPES) ×1 IMPLANT
COVER PROBE CYLINDRICAL 5X96 (MISCELLANEOUS) ×1 IMPLANT
DERMABOND ADVANCED .7 DNX12 (GAUZE/BANDAGES/DRESSINGS) ×1 IMPLANT
DRAPE LAPAROSCOPIC ABDOMINAL (DRAPES) ×1 IMPLANT
DRAPE UTILITY XL STRL (DRAPES) ×1 IMPLANT
ELECT COATED BLADE 2.86 ST (ELECTRODE) ×1 IMPLANT
ELECT REM PT RETURN 9FT ADLT (ELECTROSURGICAL) ×1
ELECTRODE REM PT RTRN 9FT ADLT (ELECTROSURGICAL) ×1 IMPLANT
GLOVE BIO SURGEON STRL SZ 6.5 (GLOVE) IMPLANT
GLOVE BIO SURGEON STRL SZ7.5 (GLOVE) ×2 IMPLANT
GLOVE BIOGEL PI IND STRL 7.0 (GLOVE) IMPLANT
GOWN STRL REUS W/ TWL LRG LVL3 (GOWN DISPOSABLE) ×2 IMPLANT
GOWN STRL REUS W/TWL LRG LVL3 (GOWN DISPOSABLE) ×3
KIT MARKER MARGIN INK (KITS) ×1 IMPLANT
NDL HYPO 25X1 1.5 SAFETY (NEEDLE) IMPLANT
NEEDLE HYPO 25X1 1.5 SAFETY (NEEDLE) ×1 IMPLANT
NS IRRIG 1000ML POUR BTL (IV SOLUTION) IMPLANT
PACK BASIN DAY SURGERY FS (CUSTOM PROCEDURE TRAY) ×1 IMPLANT
PENCIL SMOKE EVACUATOR (MISCELLANEOUS) ×1 IMPLANT
SLEEVE SCD COMPRESS KNEE MED (STOCKING) ×1 IMPLANT
SPIKE FLUID TRANSFER (MISCELLANEOUS) IMPLANT
SPONGE T-LAP 18X18 ~~LOC~~+RFID (SPONGE) ×1 IMPLANT
SUT MON AB 4-0 PC3 18 (SUTURE) ×1 IMPLANT
SUT SILK 2 0 SH (SUTURE) IMPLANT
SUT VICRYL 3-0 CR8 SH (SUTURE) ×1 IMPLANT
SYR CONTROL 10ML LL (SYRINGE) IMPLANT
TOWEL GREEN STERILE FF (TOWEL DISPOSABLE) ×1 IMPLANT
TRAY FAXITRON CT DISP (TRAY / TRAY PROCEDURE) ×1 IMPLANT
TUBE CONNECTING 20X1/4 (TUBING) ×1 IMPLANT
YANKAUER SUCT BULB TIP NO VENT (SUCTIONS) IMPLANT

## 2023-04-19 NOTE — Transfer of Care (Signed)
Immediate Anesthesia Transfer of Care Note  Patient: Sarah Pace  Procedure(s) Performed: LEFT BREAST LUMPECTOMY WITH RADIOACTIVE SEED LOCALIZATION (Left: Breast)  Patient Location: PACU  Anesthesia Type:General  Level of Consciousness: drowsy  Airway & Oxygen Therapy: Patient Spontanous Breathing and Patient connected to face mask oxygen  Post-op Assessment: Report given to RN and Post -op Vital signs reviewed and stable  Post vital signs: Reviewed and stable  Last Vitals:  Vitals Value Taken Time  BP 121/65   Temp    Pulse 65   Resp 12 04/19/23 1231  SpO2 99   Vitals shown include unvalidated device data.  Last Pain:  Vitals:   04/19/23 1133  TempSrc: Oral  PainSc: 0-No pain      Patients Stated Pain Goal: 3 (04/19/23 1133)  Complications: No notable events documented.

## 2023-04-19 NOTE — Anesthesia Postprocedure Evaluation (Signed)
Anesthesia Post Note  Patient: Sarah Pace  Procedure(s) Performed: LEFT BREAST LUMPECTOMY WITH RADIOACTIVE SEED LOCALIZATION (Left: Breast)     Patient location during evaluation: PACU Anesthesia Type: General Level of consciousness: awake Pain management: pain level controlled Vital Signs Assessment: post-procedure vital signs reviewed and stable Respiratory status: spontaneous breathing, nonlabored ventilation and respiratory function stable Cardiovascular status: blood pressure returned to baseline and stable Postop Assessment: no apparent nausea or vomiting Anesthetic complications: no   No notable events documented.  Last Vitals:  Vitals:   04/19/23 1252 04/19/23 1302  BP:  123/65  Pulse: 62 61  Resp: 11 16  Temp:  36.5 C  SpO2: 96% 98%    Last Pain:  Vitals:   04/19/23 1302  TempSrc:   PainSc: 0-No pain                 Linton Rump

## 2023-04-19 NOTE — Discharge Instructions (Signed)
No tylenol until 5:45 p.m.  Post Anesthesia Home Care Instructions  Activity: Get plenty of rest for the remainder of the day. A responsible individual must stay with you for 24 hours following the procedure.  For the next 24 hours, DO NOT: -Drive a car -Advertising copywriter -Drink alcoholic beverages -Take any medication unless instructed by your physician -Make any legal decisions or sign important papers.  Meals: Start with liquid foods such as gelatin or soup. Progress to regular foods as tolerated. Avoid greasy, spicy, heavy foods. If nausea and/or vomiting occur, drink only clear liquids until the nausea and/or vomiting subsides. Call your physician if vomiting continues.  Special Instructions/Symptoms: Your throat may feel dry or sore from the anesthesia or the breathing tube placed in your throat during surgery. If this causes discomfort, gargle with warm salt water. The discomfort should disappear within 24 hours.  If you had a scopolamine patch placed behind your ear for the management of post- operative nausea and/or vomiting:  1. The medication in the patch is effective for 72 hours, after which it should be removed.  Wrap patch in a tissue and discard in the trash. Wash hands thoroughly with soap and water. 2. You may remove the patch earlier than 72 hours if you experience unpleasant side effects which may include dry mouth, dizziness or visual disturbances. 3. Avoid touching the patch. Wash your hands with soap and water after contact with the patch.

## 2023-04-19 NOTE — Op Note (Signed)
**Note Sarah-Identified via Obfuscation** 04/19/2023  12:25 PM  PATIENT:  Sarah Pace  67 y.o. female  PRE-OPERATIVE DIAGNOSIS:  LEFT BREAST ADH  POST-OPERATIVE DIAGNOSIS:  LEFT BREAST ADH  PROCEDURE:  Procedure(s): LEFT BREAST LUMPECTOMY WITH RADIOACTIVE SEED LOCALIZATION (Left)  SURGEON:  Surgeon(s) and Role:    Griselda Miner, MD - Primary  PHYSICIAN ASSISTANT:   ASSISTANTS: none   ANESTHESIA:   local and general  EBL:  10 mL   BLOOD ADMINISTERED:none  DRAINS: none   LOCAL MEDICATIONS USED:  MARCAINE     SPECIMEN:  Source of Specimen:  left breast tissue  DISPOSITION OF SPECIMEN:  PATHOLOGY  COUNTS:  YES  TOURNIQUET:  * No tourniquets in log *  DICTATION: .Dragon Dictation  After informed consent was obtained the patient was brought to the operating room and placed in the supine position on the operating table.  After adequate induction of general anesthesia the patient's left breast was prepped with ChloraPrep, allowed to dry, and draped in usual sterile manner.  An appropriate timeout was performed.  Previously an I-125 seed was placed in the upper outer quadrant of the left breast to mark an area of atypical ductal hyperplasia.  The neoprobe was set to I-125 in the area of radioactivity was readily identified.  The area around this was infiltrated with quarter percent Marcaine.  I elected to make a curvilinear incision overlying the area of radioactivity far in the upper outer quadrant of the left breast with a 15 blade knife.  The incision was carried through the skin and subcutaneous tissue sharply with the electrocautery.  Dissection was then carried towards the radioactive seed under the direction of the neoprobe.  Once I more closely approach the radioactive seed I then removed a circular portion of breast tissue sharply with the electrocautery around the radioactive seed while checking the area of radioactivity frequently.  Once the specimen was removed it was oriented with the appropriate paint colors.   A specimen radiograph was obtained that showed the seed to be near the center of the specimen.  The clip had previously migrated away from the area.  The specimen was then sent to pathology for further evaluation.  Hemostasis was achieved using the Bovie electrocautery.  The wound was irrigated with saline and infiltrated with more quarter percent Marcaine.  The deep layer of the wound was then closed with layers of interrupted 3-0 Vicryl stitches.  The skin was then closed with a running 4-0 Monocryl subcuticular stitch.  Dermabond dressings were applied.  The patient tolerated the procedure well.  At the end of the case all needle sponge and instrument counts were correct.  The patient was then awakened and taken to recovery in stable condition.  PLAN OF CARE: Discharge to home after PACU  PATIENT DISPOSITION:  PACU - hemodynamically stable.   Delay start of Pharmacological VTE agent (>24hrs) due to surgical blood loss or risk of bleeding: not applicable

## 2023-04-19 NOTE — Anesthesia Procedure Notes (Signed)
Procedure Name: LMA Insertion Date/Time: 04/19/2023 11:52 AM  Performed by: Demetrio Lapping, CRNAPre-anesthesia Checklist: Patient identified, Emergency Drugs available, Suction available and Patient being monitored Patient Re-evaluated:Patient Re-evaluated prior to induction Oxygen Delivery Method: Circle System Utilized Preoxygenation: Pre-oxygenation with 100% oxygen Induction Type: IV induction Ventilation: Mask ventilation without difficulty LMA: LMA inserted LMA Size: 4.0 Number of attempts: 1 Airway Equipment and Method: Bite block Placement Confirmation: positive ETCO2 Tube secured with: Tape Dental Injury: Teeth and Oropharynx as per pre-operative assessment

## 2023-04-19 NOTE — H&P (Signed)
REFERRING PHYSICIAN: Sigmund Hazel PROVIDER: Lindell Noe, MD MRN: [id number] DOB: 05-Apr-1956 Subjective  Chief Complaint: No chief complaint on file.  History of Present Illness: Sarah Pace is a 67 y.o. female who is seen today as an office consultation for evaluation of No chief complaint on file.  We are asked to see the patient in consultation by Dr. Sigmund Hazel to evaluate her for an area of atypical ductal hyperplasia in the left breast. The patient is a 67 year old white female who recently went for a routine screening mammogram. At that time she was found to have a 1.3 cm of abnormal calcification in the upper outer quadrant of the left breast. This was biopsied and came back as atypical ductal hyperplasia. She is otherwise in good health and does not smoke. She has no family history of breast cancer.  Review of Systems: A complete review of systems was obtained from the patient. I have reviewed this information and discussed as appropriate with the patient. See HPI as well for other ROS.  ROS  Medical History: History reviewed. No pertinent past medical history.  Patient Active Problem List Diagnosis Atypical ductal hyperplasia of left breast  History reviewed. No pertinent surgical history.  Not on File  No current outpatient medications on file prior to visit.  No current facility-administered medications on file prior to visit.  History reviewed. No pertinent family history.  Social History  Tobacco Use Smoking Status Not on file Smokeless Tobacco Not on file   Social History  Socioeconomic History Marital status: Married  Objective: There were no vitals filed for this visit. There is no height or weight on file to calculate BMI.  Physical Exam Vitals reviewed. Constitutional: General: She is not in acute distress. Appearance: Normal appearance. HENT: Head: Normocephalic and atraumatic. Right Ear: External ear normal. Left Ear: External ear  normal. Nose: Nose normal. Mouth/Throat: Mouth: Mucous membranes are moist. Pharynx: Oropharynx is clear. Eyes: General: No scleral icterus. Extraocular Movements: Extraocular movements intact. Conjunctiva/sclera: Conjunctivae normal. Pupils: Pupils are equal, round, and reactive to light. Cardiovascular: Rate and Rhythm: Normal rate and regular rhythm. Pulses: Normal pulses. Heart sounds: Normal heart sounds. Pulmonary: Effort: Pulmonary effort is normal. No respiratory distress. Breath sounds: Normal breath sounds. Abdominal: General: Bowel sounds are normal. Palpations: Abdomen is soft. Tenderness: There is no abdominal tenderness. Musculoskeletal: General: No swelling, tenderness or deformity. Normal range of motion. Cervical back: Normal range of motion and neck supple. Skin: General: Skin is warm and dry. Coloration: Skin is not jaundiced. Neurological: General: No focal deficit present. Mental Status: She is alert and oriented to person, place, and time. Psychiatric: Mood and Affect: Mood normal. Behavior: Behavior normal.    Breast: There is no palpable mass in either breast. There is no palpable axillary, supraclavicular, or cervical lymphadenopathy. There is a small ulcerated skin lesion in the upper inner right breast from a recent skin biopsy  Labs, Imaging and Diagnostic Testing:  Assessment and Plan:  Diagnoses and all orders for this visit:  Atypical ductal hyperplasia of left breast   The patient appears to have a 1.3 cm area of atypical ductal hyperplasia in the upper outer quadrant of the left breast. Because this is considered a high risk lesion and because it has an appearance similar to ductal carcinoma in situ the recommendation is to have this area removed. She would also like to have this done. The presence of this lesion does increase her baseline risk of breast cancer to about 30%. I  have discussed with her in detail the risks and benefits of  the operation as well as some of the technical aspects including the use of a radioactive seed for localization and she understands and wishes to proceed.

## 2023-04-19 NOTE — Anesthesia Preprocedure Evaluation (Addendum)
Anesthesia Evaluation  Patient identified by MRN, date of birth, ID band Patient awake    Reviewed: Allergy & Precautions, NPO status , Patient's Chart, lab work & pertinent test results  History of Anesthesia Complications (+) history of anesthetic complications (reports having a stretch out uvula after hemorrhoid surgery)  Airway Mallampati: II  TM Distance: >3 FB Neck ROM: Full    Dental  (+) Dental Advisory Given   Pulmonary neg pulmonary ROS   Pulmonary exam normal breath sounds clear to auscultation       Cardiovascular negative cardio ROS  Rhythm:Regular Rate:Normal     Neuro/Psych negative neurological ROS     GI/Hepatic negative GI ROS, Neg liver ROS,,,  Endo/Other  negative endocrine ROS    Renal/GU negative Renal ROS     Musculoskeletal   Abdominal   Peds  Hematology negative hematology ROS (+)   Anesthesia Other Findings Left breast ADH  Reproductive/Obstetrics                             Anesthesia Physical Anesthesia Plan  ASA: 2  Anesthesia Plan: General   Post-op Pain Management: Tylenol PO (pre-op)*   Induction: Intravenous  PONV Risk Score and Plan: 3 and Ondansetron, Dexamethasone and Treatment may vary due to age or medical condition  Airway Management Planned: LMA  Additional Equipment:   Intra-op Plan:   Post-operative Plan: Extubation in OR  Informed Consent: I have reviewed the patients History and Physical, chart, labs and discussed the procedure including the risks, benefits and alternatives for the proposed anesthesia with the patient or authorized representative who has indicated his/her understanding and acceptance.     Dental advisory given  Plan Discussed with: CRNA and Anesthesiologist  Anesthesia Plan Comments: (Risks of general anesthesia discussed including, but not limited to, sore throat, hoarse voice, chipped/damaged teeth, injury  to vocal cords, nausea and vomiting, allergic reactions, lung infection, heart attack, stroke, and death. All questions answered. )        Anesthesia Quick Evaluation

## 2023-04-19 NOTE — Interval H&P Note (Signed)
History and Physical Interval Note:  04/19/2023 11:17 AM  De Hollingshead  has presented today for surgery, with the diagnosis of LEFT BREAST ADH.  The various methods of treatment have been discussed with the patient and family. After consideration of risks, benefits and other options for treatment, the patient has consented to  Procedure(s): LEFT BREAST LUMPECTOMY WITH RADIOACTIVE SEED LOCALIZATION (Left) as a surgical intervention.  The patient's history has been reviewed, patient examined, no change in status, stable for surgery.  I have reviewed the patient's chart and labs.  Questions were answered to the patient's satisfaction.     Chevis Pretty III

## 2023-04-21 ENCOUNTER — Encounter (HOSPITAL_BASED_OUTPATIENT_CLINIC_OR_DEPARTMENT_OTHER): Payer: Self-pay | Admitting: General Surgery

## 2023-04-23 LAB — SURGICAL PATHOLOGY

## 2023-04-25 ENCOUNTER — Telehealth: Payer: Self-pay | Admitting: Hematology and Oncology

## 2023-04-25 NOTE — Telephone Encounter (Signed)
scheduled per referral, pt has been called and confirmed date and time. Pt is aware of location and to arrive early for check in   

## 2023-05-02 NOTE — Progress Notes (Cosign Needed Addendum)
Radiation Oncology         [phone]) [phone] ________________________________  Name: Sarah Pace        MRN: [id number]  Date of Service: 05/03/2023 DOB: 04-17-1956  WU:[id number], Misty Stanley, MD  Griselda Miner, MD     REFERRING PHYSICIAN: Chevis Pretty III, MD   DIAGNOSIS: The encounter diagnosis was Malignant neoplasm of upper-outer quadrant of left breast in female, estrogen receptor positive (HCC).   HISTORY OF PRESENT ILLNESS: Sarah Pace is a 67 y.o. female seen at the request of Dr. Carolynne Edouard for newly diagnosed left breast cancer. She originally presented with an abnormality on screening mammogram. Diagnostic mammogram on 02/15/23 showed 1.3 cm of indeterminate calcifications in the UOQ of the left breast. Patient proceeded with a biopsy on 03/07/23 that showed atypical ductal hyperplasia. Dr. Carolynne Edouard recommended lumpectomy due to it being a high risk lesion and having an appearance similar to ductal carcinoma in situ.   Patient proceeded with a lumpectomy under the care of Dr. Carolynne Edouard on 04/19/23. Surgical pathology revealed grade II ductal carcinoma in situ 5 mm in greastest dimension. All margins were negative for ductal carcinoma in situ with the closest margin being 3 mm. Prognostic markers showed ER 90% positive with strong staining intensity, PR 30% positive with strong staining intensity.   She was referred to [place] today to discuss radiation therapy. She is scheduled to see Dr. Pamelia Hoit to discuss antiestrogen therapy on 05/07/23.    PREVIOUS RADIATION THERAPY: No   PAST MEDICAL HISTORY:  Past Medical History:  Diagnosis Date   Breast cancer (HCC) 04/19/2023   Left Breast       PAST SURGICAL HISTORY: Past Surgical History:  Procedure Laterality Date   BREAST BIOPSY Left 03/07/2023   MM LT BREAST BX W LOC DEV 1ST LESION IMAGE BX SPEC STEREO GUIDE 03/07/2023 [facility]   BREAST BIOPSY  04/18/2023   MM LT RADIOACTIVE SEED LOC MAMMO GUIDE 04/18/2023 [facility]   BREAST BIOPSY Right  02/08/2023   At the Dermatologist office, negative.   BREAST LUMPECTOMY WITH RADIOACTIVE SEED LOCALIZATION Left 04/19/2023   Procedure: LEFT BREAST LUMPECTOMY WITH RADIOACTIVE SEED LOCALIZATION;  Surgeon: Griselda Miner, MD;  Location: [facility];  Service: General;  Laterality: Left;     FAMILY HISTORY:  Family History  Problem Relation Age of Onset   Breast cancer Neg Hx      SOCIAL HISTORY:  reports that she has never smoked. She has never used smokeless tobacco. She reports that she does not drink alcohol and does not use drugs.   ALLERGIES: Iodine, Sulfa antibiotics, and Wound dressing adhesive   MEDICATIONS:  Current Outpatient Medications  Medication Sig Dispense Refill   LATANOPROST OP Apply to eye at bedtime.     No current facility-administered medications for this encounter.     REVIEW OF SYSTEMS: On review of systems, the patient reports that she is doing well overall. She has some occasional pain to the lumpectomy site and some scar tissue below the incision. She otherwise denies any specific breast complaints.      PHYSICAL EXAM:  Wt Readings from Last 3 Encounters:  05/03/23 153 lb 6.4 oz (69.6 kg)  04/19/23 154 lb 1.6 oz (69.9 kg)   Temp Readings from Last 3 Encounters:  05/03/23 97.7 F (36.5 C) (Oral)  04/19/23 97.7 F (36.5 C)   BP Readings from Last 3 Encounters:  05/03/23 126/74  04/19/23 123/65  06/22/15 130/80   Pulse Readings from  Last 3 Encounters:  05/03/23 60  04/19/23 61  06/22/15 (!) 58   Pain Assessment Pain Score: 0-No pain/10  In general this is a well appearing female in no acute distress. She's alert and oriented x4 and appropriate throughout the examination. Cardiopulmonary assessment is negative for acute distress and she exhibits normal effort.     ECOG = 0  0 - Asymptomatic (Fully active, able to carry on all predisease activities without restriction)  1 - Symptomatic but completely ambulatory  (Restricted in physically strenuous activity but ambulatory and able to carry out work of a light or sedentary nature. For example, light housework, office work)  2 - Symptomatic, <50% in bed during the day (Ambulatory and capable of all self care but unable to carry out any work activities. Up and about more than 50% of waking hours)  3 - Symptomatic, >50% in bed, but not bedbound (Capable of only limited self-care, confined to bed or chair 50% or more of waking hours)  4 - Bedbound (Completely disabled. Cannot carry on any self-care. Totally confined to bed or chair)  5 - Death   Santiago Glad MM, Creech RH, Tormey DC, et al. [phone]). "Toxicity and response criteria of the [facility]". Am. Evlyn Clines. Oncol. 5 (6): 649-55    LABORATORY DATA:  No results found for: "WBC", "HGB", "HCT", "MCV", "PLT" No results found for: "NA", "K", "CL", "CO2" No results found for: "ALT", "AST", "GGT", "ALKPHOS", "BILITOT"    RADIOGRAPHY: MM Breast Surgical Specimen  Result Date: 04/19/2023 CLINICAL DATA:  Evaluate surgical specimen following excision of LEFT breast ADH. EXAM: SPECIMEN RADIOGRAPH OF THE LEFT BREAST COMPARISON:  Previous exam(s). FINDINGS: Status post excision of the left breast. The radioactive seed is present within the specimen and intact. IMPRESSION: Specimen radiograph of the LEFT breast. Electronically Signed   By: Harmon Pier M.D.   On: 04/19/2023 12:14  MM LT RADIOACTIVE SEED LOC MAMMO GUIDE  Result Date: 04/18/2023 CLINICAL DATA:  67 year old female with recently diagnosed atypical ductal hyperplasia at site of X shaped biopsy marking clip in the upper-outer left breast presents for preoperative radioactive seed localization. Note that the X shaped biopsy marking clip is located greater than 2 cm inferior to the biopsied calcifications in the upper-outer left breast and therefore the residual calcifications rather than the biopsy marking clip will be targeted for  localization. EXAM: MAMMOGRAPHIC GUIDED RADIOACTIVE SEED LOCALIZATION OF THE LEFT BREAST COMPARISON:  Previous exam(s). FINDINGS: Patient presents for radioactive seed localization prior to left breast excision. I met with the patient and we discussed the procedure of seed localization including benefits and alternatives. We discussed the high likelihood of a successful procedure. We discussed the risks of the procedure including infection, bleeding, tissue injury and further surgery. We discussed the low dose of radioactivity involved in the procedure. Informed, written consent was given. The usual time-out protocol was performed immediately prior to the procedure. Using mammographic guidance, sterile technique, 1% lidocaine and an I-125 radioactive seed, the calcifications in the upper-outer left breast were localized using a lateral to medial approach. The follow-up mammogram images confirm the seed in the expected location and were marked for Dr. Carolynne Edouard. Follow-up survey of the patient confirms presence of the radioactive seed. Order number of I-125 seed:  [id number]. Total activity:  0.270 millicuries reference Date: 04/01/2023 The patient tolerated the procedure well and was released from the [facility]. She was given instructions regarding seed removal. IMPRESSION: Radioactive seed localization left breast. No  apparent complications. Electronically Signed   By: Edwin Cap M.D.   On: 04/18/2023 15:05      IMPRESSION: 1. 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 Patient underwent successful lumpectomy. Radiation treatment is indicated at this time to prevent risk of locoregional disease recurrence. Today we discussed the natural course of early stage breast disease. We highlighted the role of radiotherapy in the management and focused on the details of logistics and delivery. We reviewed the anticipated  acute and late sequelae associated with radiation in this setting. The patient was encouraged to ask questions that I answered to the best of my ability. Patient expressed readiness to proceed with treatment. A consent form was reviewed and signed today.    Plan:  Patient is switching to medicare at the beginning of July. We will schedule her for CT simulation later in early July. Will plan to begin radiation approximately one week after simulation. Dr. Mitzi Hansen anticipates 4 weeks of radiation therapy to the right breast. We look forward to participating in this patient's care.    In a visit lasting 60 minutes, greater than 50% of the time was spent face to face discussing the patient's condition, in preparation for the discussion, and coordinating the patient's care.   The above documentation reflects my direct findings during this shared patient visit. Please see the separate note by Dr. Mitzi Hansen on this date for the remainder of the patient's plan of care.    Joyice Faster, PA-C    **Disclaimer: This note was dictated with voice recognition software. Similar sounding words can inadvertently be transcribed and this note may contain transcription errors which may not have been corrected upon publication of note.**

## 2023-05-02 NOTE — Progress Notes (Signed)
New Breast Cancer Diagnosis: Left Breast UOQ  Did patient present with symptoms (if so, please note symptoms) or screening mammography?:Screening mammogram noted a 1.3 cm area of abnormal calcification in the upper outer quadrant of the left breast.  Location and Extent of disease :left breast. Located in the upper outer quadrant, measured 5 mm in greatest dimension. Adenopathy no.  Histology per Pathology Report: grade 2, DCIS 04/19/2023  Receptor Status: ER(positive), PR (positive), Her2-neu (), Ki-(%)  Surgeon and surgical plan, if any:  Dr. Carolynne Edouard 04/19/2023 -The patient appears to have a 1.3 cm area of atypical ductal hyperplasia in the upper outer quadrant of the left breast. -Because this is considered a high risk lesion and because it has an appearance similar to ductal carcinoma in situ the recommendation is to have this area removed.  -Left Breast Lumpectomy with radioactive seed localization 04/19/2023   Medical oncologist, treatment if any:   Dr. Pamelia Hoit 05/07/2023  Family History of Breast/Ovarian/Prostate Cancer: None  Lymphedema issues, if any: None      Pain issues, if any: No     SAFETY ISSUES: Prior radiation? No Pacemaker/ICD? No Possible current pregnancy? Postmenopausal Is the patient on methotrexate? No  Current Complaints / other details:

## 2023-05-03 ENCOUNTER — Encounter: Payer: Self-pay | Admitting: Radiation Oncology

## 2023-05-03 ENCOUNTER — Ambulatory Visit
Admission: RE | Admit: 2023-05-03 | Discharge: 2023-05-03 | Disposition: A | Discharge status: Home / Self Care | Payer: BC Managed Care – PPO | Source: Ambulatory Visit | Attending: Radiation Oncology | Admitting: Radiation Oncology

## 2023-05-03 ENCOUNTER — Other Ambulatory Visit: Payer: Self-pay

## 2023-05-03 VITALS — BP 126/74 | HR 60 | Temp 97.7°F | Resp 16 | Ht 64.0 in | Wt 153.4 lb

## 2023-05-03 DIAGNOSIS — Z170 Estrogen receptor positive status [ER+]: Secondary | ICD-10-CM | POA: Insufficient documentation

## 2023-05-03 DIAGNOSIS — C50412 Malignant neoplasm of upper-outer quadrant of left female breast: Secondary | ICD-10-CM | POA: Diagnosis not present

## 2023-05-07 ENCOUNTER — Other Ambulatory Visit: Payer: Self-pay

## 2023-05-07 ENCOUNTER — Inpatient Hospital Stay: Payer: BC Managed Care – PPO | Attending: Hematology and Oncology | Admitting: Hematology and Oncology

## 2023-05-07 ENCOUNTER — Inpatient Hospital Stay: Payer: BC Managed Care – PPO

## 2023-05-07 VITALS — BP 138/83 | HR 60 | Temp 97.9°F | Resp 18 | Ht 64.0 in | Wt 155.6 lb

## 2023-05-07 DIAGNOSIS — D0512 Intraductal carcinoma in situ of left breast: Secondary | ICD-10-CM | POA: Diagnosis not present

## 2023-05-07 DIAGNOSIS — Z170 Estrogen receptor positive status [ER+]: Secondary | ICD-10-CM | POA: Diagnosis not present

## 2023-05-07 NOTE — Progress Notes (Signed)
[facility] CONSULT NOTE  Patient Care Team: Sarah Hazel, MD as PCP - General (Family Medicine)  CHIEF COMPLAINTS/PURPOSE OF CONSULTATION:  Newly diagnosed left breast DCIS  HISTORY OF PRESENTING ILLNESS:  Sarah Pace 67 y.o. female is here because of recent diagnosis of left breast DCIS.  Patient had a mammogram that detected abnormality in the left breast which on biopsy came back as atypical ductal show.  She underwent left lumpectomy which revealed intermediate grade DCIS with necrosis measuring 5 mm margins negative and final pathology showed that the cancer cells were ER/PR positive.  She was referred to [place] for discussion regarding adjuvant treatment options.  She is healing and recovering very well from recent surgery  I reviewed her records extensively and collaborated the history with the patient.  SUMMARY OF ONCOLOGIC HISTORY: Oncology History  Ductal carcinoma in situ (DCIS) of left breast  03/07/2023 Initial Diagnosis   Left breast biopsy: Atypical ductal hyperplasia bordering on DCIS   04/19/2023 Surgery   Left lumpectomy: Intermediate grade DCIS with necrosis 5 mm, margins negative, ER 90%, PR 30%   05/07/2023 Cancer Staging   Staging form: Breast, AJCC 8th Edition - Clinical: Stage 0 (cTis (DCIS), cN0, cM0, G2, ER+, PR+, HER2: Not Assessed) - Signed by Sarah Croissant, MD on 05/07/2023 Stage prefix: Initial diagnosis Nuclear grade: G2 Histologic grading system: 3 grade system      MEDICAL HISTORY:  Past Medical History:  Diagnosis Date   Breast cancer (HCC) 04/19/2023   Left Breast    SURGICAL HISTORY: Past Surgical History:  Procedure Laterality Date   BREAST BIOPSY Left 03/07/2023   MM LT BREAST BX W LOC DEV 1ST LESION IMAGE BX SPEC STEREO GUIDE 03/07/2023 [facility]   BREAST BIOPSY  04/18/2023   MM LT RADIOACTIVE SEED LOC MAMMO GUIDE 04/18/2023 [facility]   BREAST BIOPSY Right 02/08/2023   At the Dermatologist office, negative.    BREAST LUMPECTOMY WITH RADIOACTIVE SEED LOCALIZATION Left 04/19/2023   Procedure: LEFT BREAST LUMPECTOMY WITH RADIOACTIVE SEED LOCALIZATION;  Surgeon: Sarah Miner, MD;  Location: [facility];  Service: General;  Laterality: Left;    SOCIAL HISTORY: Social History   Socioeconomic History   Marital status: Married    Spouse name: Not on file   Number of children: Not on file   Years of education: Not on file   Highest education level: Not on file  Occupational History   Not on file  Tobacco Use   Smoking status: Never   Smokeless tobacco: Never  Substance and Sexual Activity   Alcohol use: No    Alcohol/week: 0.0 standard drinks of alcohol   Drug use: No   Sexual activity: Not on file  Other Topics Concern   Not on file  Social History Narrative   Not on file   Social Determinants of Health   Financial Resource Strain: Not on file  Food Insecurity: Not on file  Transportation Needs: Not on file  Physical Activity: Not on file  Stress: Not on file  Social Connections: Not on file  Intimate Partner Violence: Not on file    FAMILY HISTORY: Family History  Problem Relation Age of Onset   Breast cancer Neg Hx     ALLERGIES:  is allergic to iodine, sulfa antibiotics, and wound dressing adhesive.  MEDICATIONS:  Current Outpatient Medications  Medication Sig Dispense Refill   LATANOPROST OP Apply to eye at bedtime.     No current facility-administered medications for  this visit.    REVIEW OF SYSTEMS:   Constitutional: Denies fevers, chills or abnormal night sweats   All other systems were reviewed with the patient and are negative.  PHYSICAL EXAMINATION: ECOG PERFORMANCE STATUS: 1 - Symptomatic but completely ambulatory  Vitals:   05/07/23 1259  BP: 138/83  Pulse: 60  Resp: 18  Temp: 97.9 F (36.6 C)  SpO2: 97%   Filed Weights   05/07/23 1259  Weight: 155 lb 9.6 oz (70.6 kg)    GENERAL:alert, no distress and comfortable     LABORATORY DATA:  I have reviewed the data as listed No results found for: "WBC", "HGB", "HCT", "MCV", "PLT" No results found for: "NA", "K", "CL", "CO2"  RADIOGRAPHIC STUDIES: I have personally reviewed the radiological reports and agreed with the findings in the report.  ASSESSMENT AND PLAN:  Ductal carcinoma in situ (DCIS) of left breast 04/19/2023: Left lumpectomy: Intermediate grade DCIS with necrosis 5 mm, margins negative, ER 90%, PR 30%  Pathology review: I discussed with the patient the difference between DCIS and invasive breast cancer. It is considered a precancerous lesion. DCIS is classified as a 0. It is generally detected through mammograms as calcifications. We discussed the significance of grades and its impact on prognosis. We also discussed the importance of ER and PR receptors and their implications to adjuvant treatment options. Prognosis of DCIS dependence on grade, comedo necrosis. It is anticipated that if not treated, 20-30% of DCIS can develop into invasive breast cancer.  Recommendation: 1. Adjuvant radiation therapy 2. Followed by antiestrogen therapy with tamoxifen 5 years (patient is not very keen on tamoxifen but will think about it and make a decision when she comes back after radiation).  I discussed with her about low-dose tamoxifen therapy does not seem to cause too many side effects and it could be an option as well.  Tamoxifen counseling: We discussed the risks and benefits of tamoxifen. These include but not limited to insomnia, hot flashes, mood changes, vaginal dryness, and weight gain. Although rare, serious side effects including endometrial cancer, risk of blood clots were also discussed. We strongly believe that the benefits far outweigh the risks. Patient understands these risks and consented to starting treatment. Planned treatment duration is 5 years.  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  Her husband  Sarah Pace works for [employer] which is a Forensic scientist that provides repair work for elderly or disabled folks.  Return to clinic after radiation to start antiestrogen therapy   All questions were answered. The patient knows to call the clinic with any problems, questions or concerns.    Tamsen Meek, MD 05/07/23

## 2023-05-07 NOTE — Assessment & Plan Note (Addendum)
04/19/2023: Left lumpectomy: Intermediate grade DCIS with necrosis 5 mm, margins negative, ER 90%, PR 30%  Pathology review: I discussed with the patient the difference between DCIS and invasive breast cancer. It is considered a precancerous lesion. DCIS is classified as a 0. It is generally detected through mammograms as calcifications. We discussed the significance of grades and its impact on prognosis. We also discussed the importance of ER and PR receptors and their implications to adjuvant treatment options. Prognosis of DCIS dependence on grade, comedo necrosis. It is anticipated that if not treated, 20-30% of DCIS can develop into invasive breast cancer.  Recommendation: 1. Adjuvant radiation therapy 2. Followed by antiestrogen therapy with tamoxifen 5 years  Tamoxifen counseling: We discussed the risks and benefits of tamoxifen. These include but not limited to insomnia, hot flashes, mood changes, vaginal dryness, and weight gain. Although rare, serious side effects including endometrial cancer, risk of blood clots were also discussed. We strongly believe that the benefits far outweigh the risks. Patient understands these risks and consented to starting treatment. Planned treatment duration is 5 years.  Return to clinic after radiation to start antiestrogen therapy

## 2023-05-09 ENCOUNTER — Encounter: Payer: Self-pay | Admitting: *Deleted

## 2023-05-27 ENCOUNTER — Ambulatory Visit
Admission: RE | Admit: 2023-05-27 | Discharge: 2023-05-27 | Disposition: A | Discharge status: Home / Self Care | Payer: Medicare Other | Source: Ambulatory Visit | Attending: Radiation Oncology | Admitting: Radiation Oncology

## 2023-05-27 ENCOUNTER — Other Ambulatory Visit: Payer: Self-pay

## 2023-05-27 DIAGNOSIS — Z170 Estrogen receptor positive status [ER+]: Secondary | ICD-10-CM | POA: Diagnosis not present

## 2023-05-27 DIAGNOSIS — Z510 Encounter for antineoplastic radiation therapy: Secondary | ICD-10-CM | POA: Insufficient documentation

## 2023-05-27 DIAGNOSIS — C50412 Malignant neoplasm of upper-outer quadrant of left female breast: Secondary | ICD-10-CM | POA: Diagnosis not present

## 2023-06-01 DIAGNOSIS — Z170 Estrogen receptor positive status [ER+]: Secondary | ICD-10-CM | POA: Diagnosis not present

## 2023-06-01 DIAGNOSIS — Z510 Encounter for antineoplastic radiation therapy: Secondary | ICD-10-CM | POA: Diagnosis not present

## 2023-06-01 DIAGNOSIS — C50412 Malignant neoplasm of upper-outer quadrant of left female breast: Secondary | ICD-10-CM | POA: Diagnosis not present

## 2023-06-03 ENCOUNTER — Encounter: Payer: Self-pay | Admitting: *Deleted

## 2023-06-03 DIAGNOSIS — D0512 Intraductal carcinoma in situ of left breast: Secondary | ICD-10-CM

## 2023-06-04 ENCOUNTER — Ambulatory Visit
Admission: RE | Admit: 2023-06-04 | Discharge: 2023-06-04 | Disposition: A | Discharge status: Home / Self Care | Payer: Medicare Other | Source: Ambulatory Visit | Attending: Radiation Oncology | Admitting: Radiation Oncology

## 2023-06-04 ENCOUNTER — Other Ambulatory Visit: Payer: Self-pay

## 2023-06-04 DIAGNOSIS — Z510 Encounter for antineoplastic radiation therapy: Secondary | ICD-10-CM | POA: Diagnosis not present

## 2023-06-04 DIAGNOSIS — Z170 Estrogen receptor positive status [ER+]: Secondary | ICD-10-CM | POA: Diagnosis not present

## 2023-06-04 DIAGNOSIS — C50412 Malignant neoplasm of upper-outer quadrant of left female breast: Secondary | ICD-10-CM | POA: Diagnosis not present

## 2023-06-04 LAB — RAD ONC ARIA SESSION SUMMARY
Course Elapsed Days: 0
Plan Fractions Treated to Date: 1
Plan Prescribed Dose Per Fraction: 2.66 Gy
Plan Total Fractions Prescribed: 16
Plan Total Prescribed Dose: 42.56 Gy
Reference Point Dosage Given to Date: 2.66 Gy
Reference Point Session Dosage Given: 2.66 Gy
Session Number: 1

## 2023-06-05 ENCOUNTER — Ambulatory Visit
Admission: RE | Admit: 2023-06-05 | Discharge: 2023-06-05 | Disposition: A | Discharge status: Home / Self Care | Payer: Medicare Other | Source: Ambulatory Visit | Attending: Radiation Oncology | Admitting: Radiation Oncology

## 2023-06-05 ENCOUNTER — Other Ambulatory Visit: Payer: Self-pay

## 2023-06-05 DIAGNOSIS — Z510 Encounter for antineoplastic radiation therapy: Secondary | ICD-10-CM | POA: Diagnosis not present

## 2023-06-05 DIAGNOSIS — Z170 Estrogen receptor positive status [ER+]: Secondary | ICD-10-CM | POA: Diagnosis not present

## 2023-06-05 DIAGNOSIS — C50412 Malignant neoplasm of upper-outer quadrant of left female breast: Secondary | ICD-10-CM | POA: Diagnosis not present

## 2023-06-05 LAB — RAD ONC ARIA SESSION SUMMARY
Course Elapsed Days: 1
Plan Fractions Treated to Date: 2
Plan Prescribed Dose Per Fraction: 2.66 Gy
Plan Total Fractions Prescribed: 16
Plan Total Prescribed Dose: 42.56 Gy
Reference Point Dosage Given to Date: 5.32 Gy
Reference Point Session Dosage Given: 2.66 Gy
Session Number: 2

## 2023-06-06 ENCOUNTER — Telehealth: Payer: Self-pay | Admitting: Hematology and Oncology

## 2023-06-06 ENCOUNTER — Ambulatory Visit: Payer: Medicare Other

## 2023-06-06 ENCOUNTER — Other Ambulatory Visit: Payer: Self-pay

## 2023-06-06 DIAGNOSIS — Z170 Estrogen receptor positive status [ER+]: Secondary | ICD-10-CM | POA: Diagnosis not present

## 2023-06-06 DIAGNOSIS — Z510 Encounter for antineoplastic radiation therapy: Secondary | ICD-10-CM | POA: Diagnosis not present

## 2023-06-06 DIAGNOSIS — C50412 Malignant neoplasm of upper-outer quadrant of left female breast: Secondary | ICD-10-CM | POA: Diagnosis not present

## 2023-06-06 LAB — RAD ONC ARIA SESSION SUMMARY
Course Elapsed Days: 2
Plan Fractions Treated to Date: 3
Plan Prescribed Dose Per Fraction: 2.66 Gy
Plan Total Fractions Prescribed: 16
Plan Total Prescribed Dose: 42.56 Gy
Reference Point Dosage Given to Date: 7.98 Gy
Reference Point Session Dosage Given: 2.66 Gy
Session Number: 3

## 2023-06-06 NOTE — Telephone Encounter (Signed)
Left a message regarding appointment times/dates

## 2023-06-07 ENCOUNTER — Ambulatory Visit
Admission: RE | Admit: 2023-06-07 | Discharge: 2023-06-07 | Disposition: A | Discharge status: Home / Self Care | Payer: Medicare Other | Source: Ambulatory Visit | Attending: Radiation Oncology | Admitting: Radiation Oncology

## 2023-06-07 ENCOUNTER — Other Ambulatory Visit: Payer: Self-pay

## 2023-06-07 DIAGNOSIS — D0512 Intraductal carcinoma in situ of left breast: Secondary | ICD-10-CM

## 2023-06-07 DIAGNOSIS — Z510 Encounter for antineoplastic radiation therapy: Secondary | ICD-10-CM | POA: Diagnosis not present

## 2023-06-07 DIAGNOSIS — C50412 Malignant neoplasm of upper-outer quadrant of left female breast: Secondary | ICD-10-CM | POA: Diagnosis not present

## 2023-06-07 DIAGNOSIS — Z170 Estrogen receptor positive status [ER+]: Secondary | ICD-10-CM | POA: Diagnosis not present

## 2023-06-07 LAB — RAD ONC ARIA SESSION SUMMARY
Course Elapsed Days: 3
Plan Fractions Treated to Date: 4
Plan Prescribed Dose Per Fraction: 2.66 Gy
Plan Total Fractions Prescribed: 16
Plan Total Prescribed Dose: 42.56 Gy
Reference Point Dosage Given to Date: 10.64 Gy
Reference Point Session Dosage Given: 2.66 Gy
Session Number: 4

## 2023-06-07 MED ORDER — RADIAPLEXRX EX GEL: Freq: Once | CUTANEOUS | Status: AC

## 2023-06-07 MED ORDER — ALRA NON-METALLIC DEODORANT (RAD-ONC)
1.0000 | Freq: Once | TOPICAL | Status: AC
  Administered 2023-06-07: 1 via TOPICAL

## 2023-06-10 ENCOUNTER — Other Ambulatory Visit: Payer: Self-pay

## 2023-06-10 ENCOUNTER — Ambulatory Visit
Admission: RE | Admit: 2023-06-10 | Discharge: 2023-06-10 | Disposition: A | Discharge status: Home / Self Care | Payer: Medicare Other | Source: Ambulatory Visit | Attending: Radiation Oncology | Admitting: Radiation Oncology

## 2023-06-10 DIAGNOSIS — C50412 Malignant neoplasm of upper-outer quadrant of left female breast: Secondary | ICD-10-CM | POA: Diagnosis not present

## 2023-06-10 DIAGNOSIS — Z510 Encounter for antineoplastic radiation therapy: Secondary | ICD-10-CM | POA: Diagnosis not present

## 2023-06-10 DIAGNOSIS — Z170 Estrogen receptor positive status [ER+]: Secondary | ICD-10-CM | POA: Diagnosis not present

## 2023-06-10 LAB — RAD ONC ARIA SESSION SUMMARY
Course Elapsed Days: 6
Plan Fractions Treated to Date: 5
Plan Prescribed Dose Per Fraction: 2.66 Gy
Plan Total Fractions Prescribed: 16
Plan Total Prescribed Dose: 42.56 Gy
Reference Point Dosage Given to Date: 13.3 Gy
Reference Point Session Dosage Given: 2.66 Gy
Session Number: 5

## 2023-06-11 ENCOUNTER — Other Ambulatory Visit: Payer: Self-pay

## 2023-06-11 ENCOUNTER — Ambulatory Visit
Admission: RE | Admit: 2023-06-11 | Discharge: 2023-06-11 | Disposition: A | Discharge status: Home / Self Care | Payer: Medicare Other | Source: Ambulatory Visit | Attending: Radiation Oncology | Admitting: Radiation Oncology

## 2023-06-11 DIAGNOSIS — C50412 Malignant neoplasm of upper-outer quadrant of left female breast: Secondary | ICD-10-CM | POA: Diagnosis not present

## 2023-06-11 DIAGNOSIS — Z510 Encounter for antineoplastic radiation therapy: Secondary | ICD-10-CM | POA: Diagnosis not present

## 2023-06-11 DIAGNOSIS — Z170 Estrogen receptor positive status [ER+]: Secondary | ICD-10-CM | POA: Diagnosis not present

## 2023-06-11 LAB — RAD ONC ARIA SESSION SUMMARY
Course Elapsed Days: 7
Plan Fractions Treated to Date: 6
Plan Prescribed Dose Per Fraction: 2.66 Gy
Plan Total Fractions Prescribed: 16
Plan Total Prescribed Dose: 42.56 Gy
Reference Point Dosage Given to Date: 15.96 Gy
Reference Point Session Dosage Given: 2.66 Gy
Session Number: 6

## 2023-06-12 ENCOUNTER — Other Ambulatory Visit: Payer: Self-pay

## 2023-06-12 ENCOUNTER — Ambulatory Visit
Admission: RE | Admit: 2023-06-12 | Discharge: 2023-06-12 | Disposition: A | Discharge status: Home / Self Care | Payer: Medicare Other | Source: Ambulatory Visit | Attending: Radiation Oncology | Admitting: Radiation Oncology

## 2023-06-12 DIAGNOSIS — C50412 Malignant neoplasm of upper-outer quadrant of left female breast: Secondary | ICD-10-CM | POA: Diagnosis not present

## 2023-06-12 DIAGNOSIS — Z510 Encounter for antineoplastic radiation therapy: Secondary | ICD-10-CM | POA: Diagnosis not present

## 2023-06-12 DIAGNOSIS — Z170 Estrogen receptor positive status [ER+]: Secondary | ICD-10-CM | POA: Diagnosis not present

## 2023-06-12 LAB — RAD ONC ARIA SESSION SUMMARY
Course Elapsed Days: 8
Plan Fractions Treated to Date: 7
Plan Prescribed Dose Per Fraction: 2.66 Gy
Plan Total Fractions Prescribed: 16
Plan Total Prescribed Dose: 42.56 Gy
Reference Point Dosage Given to Date: 18.62 Gy
Reference Point Session Dosage Given: 2.66 Gy
Session Number: 7

## 2023-06-13 ENCOUNTER — Other Ambulatory Visit: Payer: Self-pay

## 2023-06-13 ENCOUNTER — Ambulatory Visit
Admission: RE | Admit: 2023-06-13 | Discharge: 2023-06-13 | Disposition: A | Discharge status: Home / Self Care | Payer: Medicare Other | Source: Ambulatory Visit | Attending: Radiation Oncology | Admitting: Radiation Oncology

## 2023-06-13 DIAGNOSIS — Z170 Estrogen receptor positive status [ER+]: Secondary | ICD-10-CM | POA: Diagnosis not present

## 2023-06-13 DIAGNOSIS — Z510 Encounter for antineoplastic radiation therapy: Secondary | ICD-10-CM | POA: Diagnosis not present

## 2023-06-13 DIAGNOSIS — C50412 Malignant neoplasm of upper-outer quadrant of left female breast: Secondary | ICD-10-CM | POA: Diagnosis not present

## 2023-06-13 LAB — RAD ONC ARIA SESSION SUMMARY
Course Elapsed Days: 9
Plan Fractions Treated to Date: 8
Plan Prescribed Dose Per Fraction: 2.66 Gy
Plan Total Fractions Prescribed: 16
Plan Total Prescribed Dose: 42.56 Gy
Reference Point Dosage Given to Date: 21.28 Gy
Reference Point Session Dosage Given: 2.66 Gy
Session Number: 8

## 2023-06-14 ENCOUNTER — Ambulatory Visit
Admission: RE | Admit: 2023-06-14 | Discharge: 2023-06-14 | Disposition: A | Discharge status: Home / Self Care | Payer: Medicare Other | Source: Ambulatory Visit | Attending: Radiation Oncology | Admitting: Radiation Oncology

## 2023-06-14 ENCOUNTER — Other Ambulatory Visit: Payer: Self-pay

## 2023-06-14 DIAGNOSIS — C50412 Malignant neoplasm of upper-outer quadrant of left female breast: Secondary | ICD-10-CM | POA: Diagnosis not present

## 2023-06-14 DIAGNOSIS — Z510 Encounter for antineoplastic radiation therapy: Secondary | ICD-10-CM | POA: Diagnosis not present

## 2023-06-14 DIAGNOSIS — Z170 Estrogen receptor positive status [ER+]: Secondary | ICD-10-CM | POA: Diagnosis not present

## 2023-06-14 LAB — RAD ONC ARIA SESSION SUMMARY
Course Elapsed Days: 10
Plan Fractions Treated to Date: 9
Plan Prescribed Dose Per Fraction: 2.66 Gy
Plan Total Fractions Prescribed: 16
Plan Total Prescribed Dose: 42.56 Gy
Reference Point Dosage Given to Date: 23.94 Gy
Reference Point Session Dosage Given: 2.66 Gy
Session Number: 9

## 2023-06-17 ENCOUNTER — Ambulatory Visit
Admission: RE | Admit: 2023-06-17 | Discharge: 2023-06-17 | Disposition: A | Discharge status: Home / Self Care | Payer: Medicare Other | Source: Ambulatory Visit | Attending: Radiation Oncology | Admitting: Radiation Oncology

## 2023-06-17 ENCOUNTER — Other Ambulatory Visit: Payer: Self-pay

## 2023-06-17 DIAGNOSIS — Z510 Encounter for antineoplastic radiation therapy: Secondary | ICD-10-CM | POA: Diagnosis not present

## 2023-06-17 DIAGNOSIS — C50412 Malignant neoplasm of upper-outer quadrant of left female breast: Secondary | ICD-10-CM | POA: Diagnosis not present

## 2023-06-17 DIAGNOSIS — Z170 Estrogen receptor positive status [ER+]: Secondary | ICD-10-CM | POA: Diagnosis not present

## 2023-06-17 LAB — RAD ONC ARIA SESSION SUMMARY
Course Elapsed Days: 13
Plan Fractions Treated to Date: 10
Plan Prescribed Dose Per Fraction: 2.66 Gy
Plan Total Fractions Prescribed: 16
Plan Total Prescribed Dose: 42.56 Gy
Reference Point Dosage Given to Date: 26.6 Gy
Reference Point Session Dosage Given: 2.66 Gy
Session Number: 10

## 2023-06-18 ENCOUNTER — Ambulatory Visit
Admission: RE | Admit: 2023-06-18 | Discharge: 2023-06-18 | Disposition: A | Discharge status: Home / Self Care | Payer: Medicare Other | Source: Ambulatory Visit | Attending: Radiation Oncology | Admitting: Radiation Oncology

## 2023-06-18 ENCOUNTER — Other Ambulatory Visit: Payer: Self-pay

## 2023-06-18 DIAGNOSIS — Z510 Encounter for antineoplastic radiation therapy: Secondary | ICD-10-CM | POA: Diagnosis not present

## 2023-06-18 DIAGNOSIS — Z170 Estrogen receptor positive status [ER+]: Secondary | ICD-10-CM | POA: Diagnosis not present

## 2023-06-18 DIAGNOSIS — C50412 Malignant neoplasm of upper-outer quadrant of left female breast: Secondary | ICD-10-CM | POA: Diagnosis not present

## 2023-06-18 LAB — RAD ONC ARIA SESSION SUMMARY
Course Elapsed Days: 14
Plan Fractions Treated to Date: 11
Plan Prescribed Dose Per Fraction: 2.66 Gy
Plan Total Fractions Prescribed: 16
Plan Total Prescribed Dose: 42.56 Gy
Reference Point Dosage Given to Date: 29.26 Gy
Reference Point Session Dosage Given: 2.66 Gy
Session Number: 11

## 2023-06-19 ENCOUNTER — Ambulatory Visit: Admission: RE | Admit: 2023-06-19 | Payer: Medicare Other | Source: Ambulatory Visit

## 2023-06-19 ENCOUNTER — Other Ambulatory Visit: Payer: Self-pay

## 2023-06-19 DIAGNOSIS — C50412 Malignant neoplasm of upper-outer quadrant of left female breast: Secondary | ICD-10-CM | POA: Diagnosis not present

## 2023-06-19 DIAGNOSIS — Z510 Encounter for antineoplastic radiation therapy: Secondary | ICD-10-CM | POA: Diagnosis not present

## 2023-06-19 DIAGNOSIS — Z170 Estrogen receptor positive status [ER+]: Secondary | ICD-10-CM | POA: Diagnosis not present

## 2023-06-19 LAB — RAD ONC ARIA SESSION SUMMARY
Course Elapsed Days: 15
Plan Fractions Treated to Date: 12
Plan Prescribed Dose Per Fraction: 2.66 Gy
Plan Total Fractions Prescribed: 16
Plan Total Prescribed Dose: 42.56 Gy
Reference Point Dosage Given to Date: 31.92 Gy
Reference Point Session Dosage Given: 2.66 Gy
Session Number: 12

## 2023-06-20 ENCOUNTER — Ambulatory Visit
Admission: RE | Admit: 2023-06-20 | Discharge: 2023-06-20 | Disposition: A | Discharge status: Home / Self Care | Payer: Medicare Other | Source: Ambulatory Visit | Attending: Radiation Oncology | Admitting: Radiation Oncology

## 2023-06-20 ENCOUNTER — Other Ambulatory Visit: Payer: Self-pay

## 2023-06-20 DIAGNOSIS — D0512 Intraductal carcinoma in situ of left breast: Secondary | ICD-10-CM | POA: Diagnosis not present

## 2023-06-20 DIAGNOSIS — Z510 Encounter for antineoplastic radiation therapy: Secondary | ICD-10-CM | POA: Diagnosis not present

## 2023-06-20 DIAGNOSIS — Z170 Estrogen receptor positive status [ER+]: Secondary | ICD-10-CM | POA: Diagnosis not present

## 2023-06-20 DIAGNOSIS — C50412 Malignant neoplasm of upper-outer quadrant of left female breast: Secondary | ICD-10-CM | POA: Diagnosis not present

## 2023-06-20 LAB — RAD ONC ARIA SESSION SUMMARY
Course Elapsed Days: 16
Plan Fractions Treated to Date: 13
Plan Prescribed Dose Per Fraction: 2.66 Gy
Plan Total Fractions Prescribed: 16
Plan Total Prescribed Dose: 42.56 Gy
Reference Point Dosage Given to Date: 34.58 Gy
Reference Point Session Dosage Given: 2.66 Gy
Session Number: 13

## 2023-06-21 ENCOUNTER — Other Ambulatory Visit: Payer: Self-pay

## 2023-06-21 ENCOUNTER — Ambulatory Visit
Admission: RE | Admit: 2023-06-21 | Discharge: 2023-06-21 | Disposition: A | Discharge status: Home / Self Care | Payer: Medicare Other | Source: Ambulatory Visit | Attending: Radiation Oncology | Admitting: Radiation Oncology

## 2023-06-21 ENCOUNTER — Ambulatory Visit: Admission: RE | Admit: 2023-06-21 | Payer: Medicare Other | Source: Ambulatory Visit

## 2023-06-21 ENCOUNTER — Ambulatory Visit: Payer: Medicare Other | Admitting: Radiation Oncology

## 2023-06-21 DIAGNOSIS — C50412 Malignant neoplasm of upper-outer quadrant of left female breast: Secondary | ICD-10-CM | POA: Diagnosis not present

## 2023-06-21 DIAGNOSIS — D0512 Intraductal carcinoma in situ of left breast: Secondary | ICD-10-CM | POA: Diagnosis not present

## 2023-06-21 DIAGNOSIS — Z510 Encounter for antineoplastic radiation therapy: Secondary | ICD-10-CM | POA: Diagnosis not present

## 2023-06-21 DIAGNOSIS — Z170 Estrogen receptor positive status [ER+]: Secondary | ICD-10-CM | POA: Diagnosis not present

## 2023-06-21 LAB — RAD ONC ARIA SESSION SUMMARY
Course Elapsed Days: 17
Plan Fractions Treated to Date: 14
Plan Prescribed Dose Per Fraction: 2.66 Gy
Plan Total Fractions Prescribed: 16
Plan Total Prescribed Dose: 42.56 Gy
Reference Point Dosage Given to Date: 37.24 Gy
Reference Point Session Dosage Given: 2.66 Gy
Session Number: 14

## 2023-06-21 MED ORDER — RADIAPLEXRX EX GEL: Freq: Once | CUTANEOUS | Status: AC

## 2023-06-24 ENCOUNTER — Other Ambulatory Visit: Payer: Self-pay

## 2023-06-24 ENCOUNTER — Ambulatory Visit
Admission: RE | Admit: 2023-06-24 | Discharge: 2023-06-24 | Disposition: A | Discharge status: Home / Self Care | Payer: Medicare Other | Source: Ambulatory Visit | Attending: Radiation Oncology | Admitting: Radiation Oncology

## 2023-06-24 DIAGNOSIS — C50412 Malignant neoplasm of upper-outer quadrant of left female breast: Secondary | ICD-10-CM | POA: Diagnosis not present

## 2023-06-24 DIAGNOSIS — Z170 Estrogen receptor positive status [ER+]: Secondary | ICD-10-CM | POA: Diagnosis not present

## 2023-06-24 DIAGNOSIS — Z510 Encounter for antineoplastic radiation therapy: Secondary | ICD-10-CM | POA: Diagnosis not present

## 2023-06-24 DIAGNOSIS — D0512 Intraductal carcinoma in situ of left breast: Secondary | ICD-10-CM | POA: Diagnosis not present

## 2023-06-24 LAB — RAD ONC ARIA SESSION SUMMARY
Course Elapsed Days: 20
Plan Fractions Treated to Date: 15
Plan Prescribed Dose Per Fraction: 2.66 Gy
Plan Total Fractions Prescribed: 16
Plan Total Prescribed Dose: 42.56 Gy
Reference Point Dosage Given to Date: 39.9 Gy
Reference Point Session Dosage Given: 2.66 Gy
Session Number: 15

## 2023-06-25 ENCOUNTER — Other Ambulatory Visit: Payer: Self-pay

## 2023-06-25 ENCOUNTER — Ambulatory Visit
Admission: RE | Admit: 2023-06-25 | Discharge: 2023-06-25 | Disposition: A | Discharge status: Home / Self Care | Payer: Medicare Other | Source: Ambulatory Visit | Attending: Radiation Oncology | Admitting: Radiation Oncology

## 2023-06-25 ENCOUNTER — Telehealth: Payer: Self-pay

## 2023-06-25 DIAGNOSIS — Z510 Encounter for antineoplastic radiation therapy: Secondary | ICD-10-CM | POA: Diagnosis not present

## 2023-06-25 DIAGNOSIS — C50412 Malignant neoplasm of upper-outer quadrant of left female breast: Secondary | ICD-10-CM | POA: Diagnosis not present

## 2023-06-25 DIAGNOSIS — D0512 Intraductal carcinoma in situ of left breast: Secondary | ICD-10-CM | POA: Diagnosis not present

## 2023-06-25 DIAGNOSIS — Z170 Estrogen receptor positive status [ER+]: Secondary | ICD-10-CM | POA: Diagnosis not present

## 2023-06-25 LAB — RAD ONC ARIA SESSION SUMMARY
Course Elapsed Days: 21
Plan Fractions Treated to Date: 16
Plan Prescribed Dose Per Fraction: 2.66 Gy
Plan Total Fractions Prescribed: 16
Plan Total Prescribed Dose: 42.56 Gy
Reference Point Dosage Given to Date: 42.56 Gy
Reference Point Session Dosage Given: 2.66 Gy
Session Number: 16

## 2023-06-25 NOTE — Telephone Encounter (Signed)
Pt called and states she has decided not to move forward with tamoxifen. Advised pt I would discuss with MD in the morning to determine if she still needs an appt with him or not. Pt verbalized thanks and understanding.

## 2023-06-26 ENCOUNTER — Telehealth: Payer: Self-pay | Admitting: Adult Health

## 2023-06-26 ENCOUNTER — Other Ambulatory Visit: Payer: Self-pay

## 2023-06-26 ENCOUNTER — Ambulatory Visit
Admission: RE | Admit: 2023-06-26 | Discharge: 2023-06-26 | Disposition: A | Discharge status: Home / Self Care | Payer: Medicare Other | Source: Ambulatory Visit | Attending: Radiation Oncology | Admitting: Radiation Oncology

## 2023-06-26 DIAGNOSIS — Z170 Estrogen receptor positive status [ER+]: Secondary | ICD-10-CM | POA: Diagnosis not present

## 2023-06-26 DIAGNOSIS — C50412 Malignant neoplasm of upper-outer quadrant of left female breast: Secondary | ICD-10-CM | POA: Diagnosis not present

## 2023-06-26 DIAGNOSIS — D0512 Intraductal carcinoma in situ of left breast: Secondary | ICD-10-CM | POA: Diagnosis not present

## 2023-06-26 DIAGNOSIS — Z510 Encounter for antineoplastic radiation therapy: Secondary | ICD-10-CM | POA: Diagnosis not present

## 2023-06-26 LAB — RAD ONC ARIA SESSION SUMMARY
Course Elapsed Days: 22
Plan Fractions Treated to Date: 1
Plan Prescribed Dose Per Fraction: 2 Gy
Plan Total Fractions Prescribed: 4
Plan Total Prescribed Dose: 8 Gy
Reference Point Dosage Given to Date: 2 Gy
Reference Point Session Dosage Given: 2 Gy
Session Number: 17

## 2023-06-26 NOTE — Telephone Encounter (Signed)
Patient is aware of scheduled appointment time/dates

## 2023-06-26 NOTE — Telephone Encounter (Signed)
Per MD, he does not need to see her for her f/u post rxt. He recommends pt come to [facility] visit with Lillard Anes, DNP in 3 mos. S/w pt and educated her on our SCP program here and she is in agreement with this appt. Message sent to scheduling. Pt knows to call us should she have any concerns before the SCP appt.

## 2023-06-27 ENCOUNTER — Other Ambulatory Visit: Payer: Self-pay

## 2023-06-27 ENCOUNTER — Ambulatory Visit
Admission: RE | Admit: 2023-06-27 | Discharge: 2023-06-27 | Disposition: A | Discharge status: Home / Self Care | Payer: Medicare Other | Source: Ambulatory Visit | Attending: Radiation Oncology | Admitting: Radiation Oncology

## 2023-06-27 DIAGNOSIS — D0512 Intraductal carcinoma in situ of left breast: Secondary | ICD-10-CM | POA: Diagnosis not present

## 2023-06-27 LAB — RAD ONC ARIA SESSION SUMMARY
Course Elapsed Days: 23
Plan Fractions Treated to Date: 2
Plan Prescribed Dose Per Fraction: 2 Gy
Plan Total Fractions Prescribed: 4
Plan Total Prescribed Dose: 8 Gy
Reference Point Dosage Given to Date: 4 Gy
Reference Point Session Dosage Given: 2 Gy
Session Number: 18

## 2023-06-28 ENCOUNTER — Ambulatory Visit: Admission: RE | Admit: 2023-06-28 | Payer: Medicare Other | Source: Ambulatory Visit

## 2023-06-28 ENCOUNTER — Ambulatory Visit
Admission: RE | Admit: 2023-06-28 | Discharge: 2023-06-28 | Disposition: A | Discharge status: Home / Self Care | Payer: Medicare Other | Source: Ambulatory Visit | Attending: Radiation Oncology | Admitting: Radiation Oncology

## 2023-06-28 ENCOUNTER — Other Ambulatory Visit: Payer: Self-pay

## 2023-06-28 DIAGNOSIS — D0512 Intraductal carcinoma in situ of left breast: Secondary | ICD-10-CM | POA: Diagnosis not present

## 2023-06-28 LAB — RAD ONC ARIA SESSION SUMMARY
Course Elapsed Days: 24
Plan Fractions Treated to Date: 3
Plan Prescribed Dose Per Fraction: 2 Gy
Plan Total Fractions Prescribed: 4
Plan Total Prescribed Dose: 8 Gy
Reference Point Dosage Given to Date: 6 Gy
Reference Point Session Dosage Given: 2 Gy
Session Number: 19

## 2023-07-01 ENCOUNTER — Ambulatory Visit
Admission: RE | Admit: 2023-07-01 | Discharge: 2023-07-01 | Disposition: A | Discharge status: Home / Self Care | Payer: Medicare Other | Source: Ambulatory Visit | Attending: Radiation Oncology | Admitting: Radiation Oncology

## 2023-07-01 ENCOUNTER — Other Ambulatory Visit: Payer: Self-pay

## 2023-07-01 DIAGNOSIS — D0512 Intraductal carcinoma in situ of left breast: Secondary | ICD-10-CM | POA: Diagnosis not present

## 2023-07-01 DIAGNOSIS — Z170 Estrogen receptor positive status [ER+]: Secondary | ICD-10-CM | POA: Diagnosis not present

## 2023-07-01 DIAGNOSIS — C50412 Malignant neoplasm of upper-outer quadrant of left female breast: Secondary | ICD-10-CM | POA: Diagnosis not present

## 2023-07-01 DIAGNOSIS — Z510 Encounter for antineoplastic radiation therapy: Secondary | ICD-10-CM | POA: Diagnosis not present

## 2023-07-01 LAB — RAD ONC ARIA SESSION SUMMARY
Course Elapsed Days: 27
Plan Fractions Treated to Date: 4
Plan Prescribed Dose Per Fraction: 2 Gy
Plan Total Fractions Prescribed: 4
Plan Total Prescribed Dose: 8 Gy
Reference Point Dosage Given to Date: 8 Gy
Reference Point Session Dosage Given: 2 Gy
Session Number: 20

## 2023-07-02 ENCOUNTER — Inpatient Hospital Stay: Payer: Self-pay | Admitting: Hematology and Oncology

## 2023-07-02 NOTE — Radiation Completion Notes (Signed)
  Radiation Oncology         [phone]) [phone] ________________________________  Name: Sarah Pace MRN: [id number]  Date of Service: 07/01/2023  DOB: 28-May-1956  End of Treatment Note   Diagnosis: Intermediate Grade, ER/PR positive, DCIS of the left breast   Intent: Curative     ==========DELIVERED PLANS==========  First Treatment Date: 2023-06-04 - Last Treatment Date: 2023-07-01   Plan Name: Breast_L_BH Site: Breast, Left Technique: 3D Mode: Photon Dose Per Fraction: 2.66 Gy Prescribed Dose (Delivered / Prescribed): 42.56 Gy / 42.56 Gy Prescribed Fxs (Delivered / Prescribed): 16 / 16   Plan Name: Brst_L_Bst_BH Site: Breast, Left Technique: 3D Mode: Photon Dose Per Fraction: 2 Gy Prescribed Dose (Delivered / Prescribed): 8 Gy / 8 Gy Prescribed Fxs (Delivered / Prescribed): 4 / 4     ==========ON TREATMENT VISIT DATES========== 2023-06-07, 2023-06-14, 2023-06-21, 2023-06-28     See weekly On Treatment Notes in Epic for details. The patient tolerated radiation. She developed fatigue and anticipated skin changes in the treatment field.   The patient will receive a call in about one month from the radiation oncology department. She will continue follow up with Dr. Pamelia Hoit as well.      Osker Mason, PAC

## 2023-08-20 ENCOUNTER — Telehealth: Payer: Self-pay | Admitting: *Deleted

## 2023-08-20 NOTE — Telephone Encounter (Signed)
RETURNED PATIENT'S PHONE CALL, LVM FOR A RETURN CALL 

## 2023-08-26 ENCOUNTER — Ambulatory Visit
Admission: RE | Admit: 2023-08-26 | Discharge: 2023-08-26 | Disposition: A | Discharge status: Home / Self Care | Payer: Medicare Other | Source: Ambulatory Visit | Attending: Radiation Oncology | Admitting: Radiation Oncology

## 2023-08-26 NOTE — Progress Notes (Signed)
Radiation Oncology         [phone]) [phone] ________________________________  Name: Sarah Pace MRN: [id number]  Date of Service: 08/26/2023  DOB: [birth year]  Post Treatment Telephone Note  Diagnosis:  Intermediate Grade, ER/PR positive, DCIS of the left breast (as documented in provider EOT note)   The patient was not available for call today. Voicemail left.  The patient was encouraged to avoid sun exposure in the area of prior treatment for up to one year following radiation with either sunscreen or by the style of clothing worn in the sun.  The patient has scheduled follow up with her medical oncologist Dr. Pamelia Hoit for ongoing surveillance, and was encouraged to call if she develops concerns or questions regarding radiation.    Ruel Favors, LPN

## 2023-10-03 ENCOUNTER — Encounter: Payer: Self-pay | Admitting: Adult Health

## 2023-10-03 ENCOUNTER — Inpatient Hospital Stay: Payer: Medicare Other | Attending: Adult Health | Admitting: Adult Health

## 2023-10-03 VITALS — BP 120/73 | HR 61 | Temp 97.7°F | Resp 18 | Ht 64.0 in | Wt 159.4 lb

## 2023-10-03 DIAGNOSIS — I890 Lymphedema, not elsewhere classified: Secondary | ICD-10-CM | POA: Insufficient documentation

## 2023-10-03 DIAGNOSIS — D0512 Intraductal carcinoma in situ of left breast: Secondary | ICD-10-CM | POA: Insufficient documentation

## 2023-10-03 DIAGNOSIS — Z170 Estrogen receptor positive status [ER+]: Secondary | ICD-10-CM | POA: Insufficient documentation

## 2023-10-03 DIAGNOSIS — Z13820 Encounter for screening for osteoporosis: Secondary | ICD-10-CM

## 2023-10-03 NOTE — Progress Notes (Signed)
SURVIVORSHIP VISIT:  BRIEF ONCOLOGIC HISTORY:  Oncology History  Ductal carcinoma in situ (DCIS) of left breast  03/07/2023 Initial Diagnosis   Left breast biopsy: Atypical ductal hyperplasia bordering on DCIS   04/19/2023 Surgery   Left lumpectomy: Intermediate grade DCIS with necrosis 5 mm, margins negative, ER 90%, PR 30%   04/19/2023 Cancer Staging   Staging form: Breast, AJCC 8th Edition - Pathologic stage from 04/19/2023: Stage 0 (pTis (DCIS), pN0, cM0) - Signed by Loa Socks, NP on 10/02/2023 Stage prefix: Initial diagnosis   05/07/2023 Cancer Staging   Staging form: Breast, AJCC 8th Edition - Clinical: Stage 0 (cTis (DCIS), cN0, cM0, G2, ER+, PR+, HER2: Not Assessed) - Signed by Serena Croissant, MD on 05/07/2023 Stage prefix: Initial diagnosis Nuclear grade: G2 Histologic grading system: 3 grade system   04/2023 -  Anti-estrogen oral therapy   Tamoxifen x 5 years--patient opted to forego AET   06/04/2023 - 07/01/2023 Radiation Therapy   Plan Name: Breast_L_BH Site: Breast, Left Technique: 3D Mode: Photon Dose Per Fraction: 2.66 Gy Prescribed Dose (Delivered / Prescribed): 42.56 Gy / 42.56 Gy Prescribed Fxs (Delivered / Prescribed): 16 / 16   Plan Name: Brst_L_Bst_BH Site: Breast, Left Technique: 3D Mode: Photon Dose Per Fraction: 2 Gy Prescribed Dose (Delivered / Prescribed): 8 Gy / 8 Gy Prescribed Fxs (Delivered / Prescribed): 4 / 4     INTERVAL HISTORY:    Discussed the use of AI scribe software for clinical note transcription with the patient, who gave verbal consent to proceed.  Sarah Pace to review her survivorship care plan detailing her treatment course for breast cancer, as well as monitoring long-term side effects of that treatment, education regarding health maintenance, screening, and overall wellness and health promotion.     Overall, Sarah Pace  a breast cancer survivor, presents with new onset pain in the breast, arm, and side, which she  describes as a shooting sensation, akin to nerve pain. This discomfort is a recent development, having started approximately two and a half weeks prior to the consultation. The patient has a history of ductal carcinoma in situ, stage zero, which was estrogen and progesterone positive. She underwent a lumpectomy and radiation therapy, but opted not to undergo antiestrogen therapy.  The patient also reports a family history of colon cancer in her father. She has been adhering to a colon cancer screening regimen every five years, with the last screening taking place in 2019. She expresses a desire to give her body a break before the next screening which is due this year.  She is interested in lifestyle modifications to reduce the risk of cancer recurrence, particularly focusing on diet and exercise. She has been provided with resources on healthy eating and is interested in incorporating more fruits and vegetables into her diet, while limiting processed foods.  REVIEW OF SYSTEMS:  Review of Systems  Constitutional:  Negative for appetite change, chills, fatigue, fever and unexpected weight change.  HENT:   Negative for hearing loss, lump/mass and trouble swallowing.   Eyes:  Negative for eye problems and icterus.  Respiratory:  Negative for chest tightness, cough and shortness of breath.   Cardiovascular:  Negative for chest pain, leg swelling and palpitations.  Gastrointestinal:  Negative for abdominal distention, abdominal pain, constipation, diarrhea, nausea and vomiting.  Endocrine: Negative for hot flashes.  Genitourinary:  Negative for difficulty urinating.   Musculoskeletal:  Negative for arthralgias.  Skin:  Negative for itching and rash.  Neurological:  Negative for dizziness,  extremity weakness, headaches and numbness.  Hematological:  Negative for adenopathy. Does not bruise/bleed easily.  Psychiatric/Behavioral:  Negative for depression. The patient is not nervous/anxious.   Breast:  Denies any new nodularity, masses, tenderness, nipple changes, or nipple discharge.       PAST MEDICAL/SURGICAL HISTORY:  Past Medical History:  Diagnosis Date   Breast cancer (HCC) 04/19/2023   Left Breast   Past Surgical History:  Procedure Laterality Date   BREAST BIOPSY Left 03/07/2023   MM LT BREAST BX W LOC DEV 1ST LESION IMAGE BX SPEC STEREO GUIDE 03/07/2023 [facility]   BREAST BIOPSY  04/18/2023   MM LT RADIOACTIVE SEED LOC MAMMO GUIDE 04/18/2023 [facility]   BREAST BIOPSY Right 02/08/2023   At the Dermatologist office, negative.   BREAST LUMPECTOMY WITH RADIOACTIVE SEED LOCALIZATION Left 04/19/2023   Procedure: LEFT BREAST LUMPECTOMY WITH RADIOACTIVE SEED LOCALIZATION;  Surgeon: Griselda Miner, MD;  Location: [facility];  Service: General;  Laterality: Left;     ALLERGIES:  Allergies  Allergen Reactions   Iodine Shortness Of Breath and Rash   Sulfa Antibiotics Nausea Only   Wound Dressing Adhesive Rash    Bandaid adhesive     CURRENT MEDICATIONS:  Outpatient Encounter Medications as of 10/03/2023  Medication Sig   LATANOPROST OP Apply to eye at bedtime.   No facility-administered encounter medications on file as of 10/03/2023.     ONCOLOGIC FAMILY HISTORY:  Family History  Problem Relation Age of Onset   Colon cancer Father 35   Breast cancer Neg Hx      SOCIAL HISTORY:  Social History   Socioeconomic History   Marital status: Married    Spouse name: Not on file   Number of children: Not on file   Years of education: Not on file   Highest education level: Not on file  Occupational History   Not on file  Tobacco Use   Smoking status: Never   Smokeless tobacco: Never  Substance and Sexual Activity   Alcohol use: No    Alcohol/week: 0.0 standard drinks of alcohol   Drug use: No   Sexual activity: Not on file  Other Topics Concern   Not on file  Social History Narrative   Not on file   Social Determinants of  Health   Financial Resource Strain: Not on file  Food Insecurity: Not on file  Transportation Needs: Not on file  Physical Activity: Not on file  Stress: Not on file  Social Connections: Not on file  Intimate Partner Violence: Not on file     OBSERVATIONS/OBJECTIVE:  BP 120/73 (BP Location: Left Arm, Patient Position: Sitting)   Pulse 61   Temp 97.7 F (36.5 C)   Resp 18   Ht 5\' 4"  (1.626 m)   Wt 159 lb 6.4 oz (72.3 kg)   SpO2 100%   BMI 27.36 kg/m  GENERAL: Patient is a well appearing female in no acute distress HEENT:  Sclerae anicteric.  Oropharynx clear and moist. No ulcerations or evidence of oropharyngeal candidiasis. Neck is supple.  NODES:  No cervical, supraclavicular, or axillary lymphadenopathy palpated.  BREAST EXAM: Left breast status post lumpectomy and adjuvant radiation, swelling is noted in the left breast consistent with breast lymphedema, no signs of local recurrence right breast is benign LUNGS:  Clear to auscultation bilaterally.  No wheezes or rhonchi. HEART:  Regular rate and rhythm. No murmur appreciated. ABDOMEN:  Soft, nontender.  Positive, normoactive bowel sounds. No organomegaly palpated.  MSK:  No focal spinal tenderness to palpation. Full range of motion bilaterally in the upper extremities. EXTREMITIES:  No peripheral edema.   SKIN:  Clear with no obvious rashes or skin changes. No nail dyscrasia. NEURO:  Nonfocal. Well oriented.  Appropriate affect.   LABORATORY DATA:  None for this visit.  DIAGNOSTIC IMAGING:  None for this visit.      ASSESSMENT AND PLAN:  Ms.. Pace is a pleasant 67 y.o. female with Stage 0 left breast DCIS, ER+/PR+, diagnosed in 04/2023, treated with lumpectomy, adjuvant radiation therapy. She presents to the Survivorship Clinic for our initial meeting and routine follow-up post-completion of treatment for breast cancer.    1. Stage 0 left breast cancer:  Ms. Sarah Pace is continuing to recover from definitive treatment  for breast cancer. She will follow-up with her medical oncologist, Dr.  Pamelia Hoit or [facility] in 6 months with history and physical exam per surveillance protocol.  Her mammogram is due 01/2024; orders placed today.   Today, a comprehensive survivorship care plan and treatment summary was reviewed with the patient today detailing her breast cancer diagnosis, treatment course, potential late/long-term effects of treatment, appropriate follow-up care with recommendations for the future, and patient education resources.  A copy of this summary, along with a letter will be sent to the patient's primary care provider via mail/fax/In Basket message after today's visit.    2. Breast lymphedema: New onset pain and breast heaviness.  Exam consistent with breast lymphedema no signs of cellulitis. -Refer to PT for breast lymphedema evaluation and treatment. .   3. Bone health:  Given Ms. Cress's age, she is at risk for bone demineralization.  She has never undergone bone density testing and request that I order this for her to be completed when she undergoes her mammogram which is due in March 2025; orders placed today.  She was given education on specific activities to promote bone health.  4. Cancer screening:  Due to Ms. Chery's history and her age, she should receive screening for skin cancers, colon cancer, and gynecologic cancers.  The information and recommendations are listed on the patient's comprehensive care plan/treatment summary and were reviewed in detail with the patient.    5. Health maintenance and wellness promotion: Ms. Renz was encouraged to consume 5-7 servings of fruits and vegetables per day. We reviewed the "Nutrition Rainbow" handout.  She was also encouraged to engage in moderate to vigorous exercise for 30 minutes per day most days of the week.  She was instructed to limit her alcohol consumption and continue to abstain from tobacco use.     6. Support services/counseling: It is not uncommon  for this period of the patient's cancer care trajectory to be one of many emotions and stressors.   She was given information regarding our available services and encouraged to contact me with any questions or for help enrolling in any of our support group/programs.    Follow up instructions:    -Return to cancer center in 6 months for f/u  -Mammogram due in 01/2024 -Bone density testing in 01/2024 -She is welcome to return back to the Survivorship Clinic at any time; no additional follow-up needed at this time.  -Consider referral back to survivorship as a long-term survivor for continued surveillance  The patient was provided an opportunity to ask questions and all were answered. The patient agreed with the plan and demonstrated an understanding of the instructions.   Total encounter time:40 minutes*in face-to-face visit time, chart review,  lab review, care coordination, order entry, and documentation of the encounter time.    Lillard Anes, NP 10/03/23 2:29 PM Medical Oncology and Hematology [facility] [address] [place], [place] [address] Tel. [phone]    Fax. [phone]  *Total Encounter Time as defined by the Centers for Medicare and Medicaid Services includes, in addition to the face-to-face time of a patient visit (documented in the note above) non-face-to-face time: obtaining and reviewing outside history, ordering and reviewing medications, tests or procedures, care coordination (communications with other health care professionals or caregivers) and documentation in the medical record.

## 2023-10-04 DIAGNOSIS — H53143 Visual discomfort, bilateral: Secondary | ICD-10-CM | POA: Diagnosis not present

## 2023-10-04 DIAGNOSIS — H524 Presbyopia: Secondary | ICD-10-CM | POA: Diagnosis not present

## 2023-10-04 DIAGNOSIS — H2513 Age-related nuclear cataract, bilateral: Secondary | ICD-10-CM | POA: Diagnosis not present

## 2023-10-04 DIAGNOSIS — H52223 Regular astigmatism, bilateral: Secondary | ICD-10-CM | POA: Diagnosis not present

## 2023-10-04 DIAGNOSIS — H401131 Primary open-angle glaucoma, bilateral, mild stage: Secondary | ICD-10-CM | POA: Diagnosis not present

## 2023-10-05 ENCOUNTER — Telehealth: Payer: Self-pay | Admitting: Adult Health

## 2023-10-05 NOTE — Telephone Encounter (Signed)
Spoke with patient confirming upcoming appointment  

## 2023-10-08 ENCOUNTER — Ambulatory Visit: Payer: Medicare Other | Attending: Adult Health | Admitting: Physical Therapy

## 2023-10-08 ENCOUNTER — Other Ambulatory Visit: Payer: Self-pay

## 2023-10-08 ENCOUNTER — Encounter: Payer: Self-pay | Admitting: Physical Therapy

## 2023-10-08 DIAGNOSIS — R293 Abnormal posture: Secondary | ICD-10-CM | POA: Insufficient documentation

## 2023-10-08 DIAGNOSIS — D0512 Intraductal carcinoma in situ of left breast: Secondary | ICD-10-CM | POA: Insufficient documentation

## 2023-10-08 DIAGNOSIS — L599 Disorder of the skin and subcutaneous tissue related to radiation, unspecified: Secondary | ICD-10-CM | POA: Diagnosis not present

## 2023-10-08 DIAGNOSIS — Z923 Personal history of irradiation: Secondary | ICD-10-CM | POA: Insufficient documentation

## 2023-10-08 DIAGNOSIS — I890 Lymphedema, not elsewhere classified: Secondary | ICD-10-CM | POA: Insufficient documentation

## 2023-10-08 DIAGNOSIS — M25612 Stiffness of left shoulder, not elsewhere classified: Secondary | ICD-10-CM | POA: Insufficient documentation

## 2023-10-08 NOTE — Therapy (Signed)
OUTPATIENT PHYSICAL THERAPY  UPPER EXTREMITY ONCOLOGY EVALUATION  Patient Name: Sarah Pace MRN: [id number] DOB:12/22/1955, 67 y.o., female Today's Date: 10/08/2023  END OF SESSION:  PT End of Session - 10/08/23 1143     Visit Number 1    Number of Visits 9    Date for PT Re-Evaluation 11/05/23    PT Start Time 1105    PT Stop Time 1143    PT Time Calculation (min) 38 min    Activity Tolerance Patient tolerated treatment well    Behavior During Therapy [facility] for tasks assessed/performed             Past Medical History:  Diagnosis Date   Breast cancer (HCC) 04/19/2023   Left Breast   Past Surgical History:  Procedure Laterality Date   BREAST BIOPSY Left 03/07/2023   MM LT BREAST BX W LOC DEV 1ST LESION IMAGE BX SPEC STEREO GUIDE 03/07/2023 [facility]   BREAST BIOPSY  04/18/2023   MM LT RADIOACTIVE SEED LOC MAMMO GUIDE 04/18/2023 [facility]   BREAST BIOPSY Right 02/08/2023   At the Dermatologist office, negative.   BREAST LUMPECTOMY WITH RADIOACTIVE SEED LOCALIZATION Left 04/19/2023   Procedure: LEFT BREAST LUMPECTOMY WITH RADIOACTIVE SEED LOCALIZATION;  Surgeon: Sarah Pretty III, MD;  Location: [facility];  Service: General;  Laterality: Left;   Patient Active Problem List   Diagnosis Date Noted   Ductal carcinoma in situ (DCIS) of left breast 05/07/2023    PCP: Sarah Hazel, MD  REFERRING PROVIDER: Loa Socks, NP  REFERRING DIAG:  [phone] (ICD-10-CM) - Ductal carcinoma in situ (DCIS) of left breast I89.0 (ICD-10-CM) - Lymphedema of breast  THERAPY DIAG:  Lymphedema, not elsewhere classified  Disorder of the skin and subcutaneous tissue related to radiation, unspecified  Stiffness of left shoulder, not elsewhere classified  Abnormal posture  Ductal carcinoma in situ of left breast  ONSET DATE: 08/04/23  Rationale for Evaluation and Treatment: Rehabilitation  SUBJECTIVE:                                                                                                                                                                                            SUBJECTIVE STATEMENT: I have been having some swelling in my L breast and some pain in moving my arm.   PERTINENT HISTORY: 03/07/2023 -Left breast biopsy: Atypical ductal hyperplasia bordering on DCIS; 04/19/2023 - Left lumpectomy: Intermediate grade DCIS with necrosis 5 mm, margins negative, ER 90%, PR 30% 04/2023 - Tamoxifen x 5 years;  06/04/2023 - 07/01/2023 - pt completed radiation  PAIN:  Are you having pain? Yes NPRS scale:  1-2/10 Pain location: dull pain in L breast Pain orientation: Left  PAIN TYPE: dull Pain description: constant  Aggravating factors: moving  Relieving factors: wearing bra  PRECAUTIONS: Other: L breast lymphedema  RED FLAGS: None   WEIGHT BEARING RESTRICTIONS: No  FALLS:  Has patient fallen in last 6 months? 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  COGNITION: Overall cognitive status: Within functional limits for tasks assessed   PALPATION: Increased fluid palpable especially in L lateral trunk and inferior breast  OBSERVATIONS / OTHER ASSESSMENTS: fullness in L breast but especially L lateral trunk and inferior axilla  POSTURE: forward head, rounded shoulders  UPPER EXTREMITY AROM/PROM:  A/PROM RIGHT   eval   Shoulder extension 72  Shoulder flexion 155  Shoulder abduction  157  Shoulder internal rotation 10 old injury  Shoulder external rotation 84    (Blank rows = not tested)  A/PROM LEFT   eval  Shoulder extension 75  Shoulder flexion 160  Shoulder abduction 159  Shoulder internal rotation 67  Shoulder external rotation 89    (Blank rows = not tested)  CERVICAL AROM: All within normal limits:    Percent limited  Flexion   Extension   Right lateral flexion   Left lateral flexion   Right rotation   Left rotation     UPPER EXTREMITY STRENGTH:   LYMPHEDEMA ASSESSMENTS:   SURGERY TYPE/DATE: 04/19/23  NUMBER OF LYMPH NODES REMOVED: 0  CHEMOTHERAPY: none  RADIATION:completed 07/01/23    LYMPHEDEMA ASSESSMENTS:   LANDMARK RIGHT  eval  At axilla  29.5  15 cm proximal to olecranon process 28  10 cm proximal to olecranon process 27  Olecranon process 23.6  15 cm proximal to ulnar styloid process 22.3  10 cm proximal to ulnar styloid process 18.7  Just proximal to ulnar styloid process 14.2  Across hand at thumb web space 18.5  At base of 2nd digit 5.8  (Blank rows = not tested)  LANDMARK LEFT  eval  At axilla  30.4  15 cm proximal to olecranon process 29  10 cm proximal to olecranon process 27.9  Olecranon process 23.9  15 cm proximal to ulnar styloid process 21.5  10 cm proximal to ulnar styloid process 18.6  Just proximal to ulnar styloid process 14.1  Across hand at thumb web space 18.5  At base of 2nd digit 5.5  (Blank rows = not tested)   QUICK DASH SURVEY:   Sarah Pace - 10/08/23 0001     Open a tight or new jar Mild difficulty    Do heavy household chores (wash walls, wash floors) Mild difficulty    Carry a shopping bag or briefcase No difficulty    Wash your back Mild difficulty    Use a knife to cut food No difficulty    Recreational activities in which you take some force or impact through your arm, shoulder, or hand (golf, hammering, tennis) Mild difficulty    During the past week, to what extent  has your  arm, shoulder or hand problem interfered with your normal social activities with family, friends, neighbors, or groups? Slightly    During the past week, to what extent has your arm, shoulder or hand problem limited your work or other regular daily activities Slightly    Arm, shoulder, or hand pain. Severe    Tingling (pins and needles) in your arm, shoulder, or hand None    Difficulty Sleeping Moderate difficulty    DASH Score 25 %            BREAST COMPLAINTS SCALE: 47   TODAY'S TREATMENT:                                                                                                                                          DATE:  10/08/23: PROM to L shoulder to end range flexion and abduction while performing STM to L pec to decrease tightness; created foam chip pack for pt to wear in bra against lateral trunk to decrease swelling and also 1/2 inch grey foam in thick stockinette for pt to alternate with foam. Issued script for pt to obtain a compression bra from 2nd to [employer]    PATIENT EDUCATION:  Education details: anatomy and physiology of the lymphatic system, need for compression Person educated: Patient Education method: Explanation Education comprehension: verbalized understanding  HOME EXERCISE PROGRAM: Obtain compression bra Wear foam or chip pack at L lateral trunk  ASSESSMENT:  CLINICAL IMPRESSION: Patient is a 67 y.o. female who was seen today for physical therapy evaluation and treatment for L breast lymphedema, L axillary tightness and discomfort and L pec tightness. Pt underwent a L lumpectomy on 04/19/23 followed by radiation. She did not have any axillary nodes removed. She developed L breast swelling a couple of months ago and her job as a Social worker requires frequent lifting. Discussed with pt that this could have been a trigger. She does not have a compression bra so issued info on how to obtain. She would benefit from skilled PT services to decrease L breast  swelling, instruct pt in self MLD, decrease L pec tightness and instruct in postural exercises.     OBJECTIVE IMPAIRMENTS: decreased knowledge of condition, decreased knowledge of use of DME, decreased ROM, decreased strength, increased edema, increased fascial restrictions, impaired UE functional use, postural dysfunction, and pain.   ACTIVITY LIMITATIONS: carrying, lifting, and reach over head  PARTICIPATION LIMITATIONS:  none  PERSONAL FACTORS:  none  are also affecting patient's functional outcome.   REHAB POTENTIAL: Good  CLINICAL DECISION MAKING: Stable/uncomplicated  EVALUATION COMPLEXITY: Low  GOALS: Goals reviewed with patient? Yes  SHORT TERM GOALS=LONG TERM GOALS Target date: 11/05/23  Pt will be independent in self MLD for long term management of lymphedema. Baseline: Goal status: INITIAL  2.  Pt will obtain a compression bra for long term management of lymphedema. Baseline:  Goal status: INITIAL  3.  Pt will report a 75% improvement in pain and tightness in L upper quadrant to allow improved comfort. Baseline:  Goal status: INITIAL  4.  Pt will be independent in a home exercise program for improving posture and UE strength.  Baseline:  Goal status: INITIAL  5.  Pt will report at least a 25% improvement in L lateral trunk and breast swelling to decrease risk of infection.  Baseline:  Goal status: INITIAL   PLAN:  PT FREQUENCY: 2x/week  PT DURATION: 4 weeks  PLANNED INTERVENTIONS: 97164- PT Re-evaluation, 97110-Therapeutic exercises, 97530- Therapeutic activity, 97535- Self Care, 16109- Manual therapy, 97760- Orthotic Fit/training, Patient/Family education, Joint mobilization, Therapeutic exercises, Therapeutic activity, and Self Care  PLAN FOR NEXT SESSION: begin MLD to L breast and lateral trunk and eventually instruct pt, did she obtain a compression bra?, give supine scap eventually  [employer], PT 10/08/2023, 12:02 PM

## 2023-10-15 ENCOUNTER — Ambulatory Visit: Payer: Medicare Other | Admitting: Physical Therapy

## 2023-10-15 ENCOUNTER — Encounter: Payer: Self-pay | Admitting: Physical Therapy

## 2023-10-15 DIAGNOSIS — L599 Disorder of the skin and subcutaneous tissue related to radiation, unspecified: Secondary | ICD-10-CM

## 2023-10-15 DIAGNOSIS — Z923 Personal history of irradiation: Secondary | ICD-10-CM | POA: Diagnosis not present

## 2023-10-15 DIAGNOSIS — D0512 Intraductal carcinoma in situ of left breast: Secondary | ICD-10-CM | POA: Diagnosis not present

## 2023-10-15 DIAGNOSIS — I890 Lymphedema, not elsewhere classified: Secondary | ICD-10-CM

## 2023-10-15 DIAGNOSIS — R293 Abnormal posture: Secondary | ICD-10-CM

## 2023-10-15 DIAGNOSIS — M25612 Stiffness of left shoulder, not elsewhere classified: Secondary | ICD-10-CM | POA: Diagnosis not present

## 2023-10-15 DIAGNOSIS — C50912 Malignant neoplasm of unspecified site of left female breast: Secondary | ICD-10-CM | POA: Diagnosis not present

## 2023-10-15 NOTE — Therapy (Signed)
OUTPATIENT PHYSICAL THERAPY  UPPER EXTREMITY ONCOLOGY EVALUATION  Patient Name: Sarah Pace MRN: [id number] DOB:Jul 06, 1956, 67 y.o., female Today's Date: 10/15/2023  END OF SESSION:  PT End of Session - 10/15/23 1402     Visit Number 2    Number of Visits 9    Date for PT Re-Evaluation 11/05/23    PT Start Time 1402    PT Stop Time 1455    PT Time Calculation (min) 53 min    Activity Tolerance Patient tolerated treatment well    Behavior During Therapy [facility] for tasks assessed/performed             Past Medical History:  Diagnosis Date   Breast cancer (HCC) 04/19/2023   Left Breast   Past Surgical History:  Procedure Laterality Date   BREAST BIOPSY Left 03/07/2023   MM LT BREAST BX W LOC DEV 1ST LESION IMAGE BX SPEC STEREO GUIDE 03/07/2023 [facility]   BREAST BIOPSY  04/18/2023   MM LT RADIOACTIVE SEED LOC MAMMO GUIDE 04/18/2023 [facility]   BREAST BIOPSY Right 02/08/2023   At the Dermatologist office, negative.   BREAST LUMPECTOMY WITH RADIOACTIVE SEED LOCALIZATION Left 04/19/2023   Procedure: LEFT BREAST LUMPECTOMY WITH RADIOACTIVE SEED LOCALIZATION;  Surgeon: Chevis Pretty III, MD;  Location: [facility];  Service: General;  Laterality: Left;   Patient Active Problem List   Diagnosis Date Noted   Ductal carcinoma in situ (DCIS) of left breast 05/07/2023    PCP: Sigmund Hazel, MD  REFERRING PROVIDER: Loa Socks, NP  REFERRING DIAG:  [phone] (ICD-10-CM) - Ductal carcinoma in situ (DCIS) of left breast I89.0 (ICD-10-CM) - Lymphedema of breast  THERAPY DIAG:  Lymphedema, not elsewhere classified  Disorder of the skin and subcutaneous tissue related to radiation, unspecified  Stiffness of left shoulder, not elsewhere classified  Abnormal posture  Ductal carcinoma in situ of left breast  ONSET DATE: 08/04/23  Rationale for Evaluation and Treatment: Rehabilitation  SUBJECTIVE:                                                                                                                                                                                            SUBJECTIVE STATEMENT: I am having the same discomfort. I wore the foam some. I got an appointment for the compression bra after this appointment.   PERTINENT HISTORY: 03/07/2023 -Left breast biopsy: Atypical ductal hyperplasia bordering on DCIS; 04/19/2023 - Left lumpectomy: Intermediate grade DCIS with necrosis 5 mm, margins negative, ER 90%, PR 30% 04/2023 - Tamoxifen x 5 years;  06/04/2023 - 07/01/2023 - pt completed radiation  PAIN:  Are you  having pain? Yes NPRS scale: 1-2/10 Pain location: dull pain in L breast Pain orientation: Left  PAIN TYPE: dull Pain description: constant  Aggravating factors: moving  Relieving factors: wearing bra  PRECAUTIONS: Other: L breast lymphedema  RED FLAGS: None   WEIGHT BEARING RESTRICTIONS: No  FALLS:  Has patient fallen in last 6 months? 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  COGNITION: Overall cognitive status: Within functional limits for tasks assessed   PALPATION: Increased fluid palpable especially in L lateral trunk and inferior breast  OBSERVATIONS / OTHER ASSESSMENTS: fullness in L breast but especially L lateral trunk and inferior axilla  POSTURE: forward head, rounded shoulders  UPPER EXTREMITY AROM/PROM:  A/PROM RIGHT   eval   Shoulder extension 72   Shoulder flexion 155  Shoulder abduction 157  Shoulder internal rotation 55 old injury  Shoulder external rotation 84    (Blank rows = not tested)  A/PROM LEFT   eval  Shoulder extension 75  Shoulder flexion 160  Shoulder abduction 159  Shoulder internal rotation 67  Shoulder external rotation 89    (Blank rows = not tested)  CERVICAL AROM: All within normal limits:    Percent limited  Flexion   Extension   Right lateral flexion   Left lateral flexion   Right rotation   Left rotation     UPPER EXTREMITY STRENGTH:   LYMPHEDEMA ASSESSMENTS:   SURGERY TYPE/DATE: 04/19/23  NUMBER OF LYMPH NODES REMOVED: 0  CHEMOTHERAPY: none  RADIATION:completed 07/01/23    LYMPHEDEMA ASSESSMENTS:   LANDMARK RIGHT  eval  At axilla  29.5  15 cm proximal to olecranon process 28  10 cm proximal to olecranon process 27  Olecranon process 23.6  15 cm proximal to ulnar styloid process 22.3  10 cm proximal to ulnar styloid process 18.7  Just proximal to ulnar styloid process 14.2  Across hand at thumb web space 18.5  At base of 2nd digit 5.8  (Blank rows = not tested)  LANDMARK LEFT  eval  At axilla  30.4  15 cm proximal to olecranon process 29  10 cm proximal to olecranon process 27.9  Olecranon process 23.9  15 cm proximal to ulnar styloid process 21.5  10 cm proximal to ulnar styloid process 18.6  Just proximal to ulnar styloid process 14.1  Across hand at thumb web space 18.5  At base of 2nd digit 5.5  (Blank rows = not tested)   QUICK DASH SURVEY:    BREAST COMPLAINTS SCALE: 47   TODAY'S TREATMENT:  DATE:  10/15/23: MLD as follows in supine: short neck, R axillary nodes, L axillary nodes, establishment of interaxillary pathway, L breast moving fluid towards pathway and towards L axillary nodes (all nodes intact) and L UE  working proximal to distal then retracing all steps moving fluid towards L axillary nodes and interaxillary pathway. Educated pt throughout in anatomy and physiology of the lymphatic system and principles of MLD.  Demonstrated doorway and corner stretch and had pt return demonstrate - educated pt to hold stretch for 60 sec  10/08/23: PROM to L shoulder to end range flexion and abduction while performing STM to L pec to decrease tightness; created foam chip pack for pt to wear in bra against lateral trunk to decrease swelling and also 1/2 inch grey foam in thick stockinette for pt to alternate with foam. Issued script for pt to obtain a compression bra from [employer] to [employer]    PATIENT EDUCATION:  Education details: MLD to L breast and UE Person educated: Patient Education method: Explanation Education comprehension: verbalized understanding  HOME EXERCISE PROGRAM: Obtain compression bra Wear foam or chip pack at L lateral trunk  ASSESSMENT:  CLINICAL IMPRESSION:  Began MLD to L breast and LUE today. Used L axillary nodes and R axillary nodes since no nodes were removed. Educated pt throughout and educated her to begin to practice this at home. Instructed pt in pec stretch at doorway and corner.    OBJECTIVE IMPAIRMENTS: decreased knowledge of condition, decreased knowledge of use of DME, decreased ROM, decreased strength, increased edema, increased fascial restrictions, impaired UE functional use, postural dysfunction, and pain.   ACTIVITY LIMITATIONS: carrying, lifting, and reach over head  PARTICIPATION LIMITATIONS:  none  PERSONAL FACTORS:  none  are also affecting patient's functional outcome.   REHAB POTENTIAL: Good  CLINICAL DECISION MAKING: Stable/uncomplicated  EVALUATION COMPLEXITY: Low  GOALS: Goals reviewed with patient? Yes  SHORT TERM GOALS=LONG TERM GOALS Target date: 11/05/23  Pt will be independent in self MLD for long term management of  lymphedema. Baseline: Goal status: INITIAL  2.  Pt will obtain a compression bra for long term management of lymphedema. Baseline:  Goal status: INITIAL  3.  Pt will report a 75% improvement in pain and tightness in L upper quadrant to allow improved comfort. Baseline:  Goal status: INITIAL  4.  Pt will be independent in a home exercise program for improving posture and UE strength.  Baseline:  Goal status: INITIAL  5.  Pt will report at least a 25% improvement in L lateral trunk and breast swelling to decrease risk of infection.  Baseline:  Goal status: INITIAL   PLAN:  PT FREQUENCY: 2x/week  PT DURATION: 4 weeks  PLANNED INTERVENTIONS: 97164- PT Re-evaluation, 97110-Therapeutic exercises, 97530- Therapeutic activity, 97535- Self Care, 16109- Manual therapy, 97760- Orthotic Fit/training, Patient/Family education, Joint mobilization, Therapeutic exercises, Therapeutic activity, and Self Care  PLAN FOR NEXT SESSION: continue MLD to L breast and lateral trunk and eventually instruct pt, did she obtain a compression bra?, give supine scap eventually  [employer], PT 10/15/2023, 2:57 PM

## 2023-10-21 ENCOUNTER — Encounter: Payer: Self-pay | Admitting: Physical Therapy

## 2023-10-21 ENCOUNTER — Ambulatory Visit: Payer: Medicare Other | Attending: Adult Health | Admitting: Physical Therapy

## 2023-10-21 DIAGNOSIS — D0512 Intraductal carcinoma in situ of left breast: Secondary | ICD-10-CM | POA: Insufficient documentation

## 2023-10-21 DIAGNOSIS — L599 Disorder of the skin and subcutaneous tissue related to radiation, unspecified: Secondary | ICD-10-CM | POA: Diagnosis not present

## 2023-10-21 DIAGNOSIS — M25612 Stiffness of left shoulder, not elsewhere classified: Secondary | ICD-10-CM | POA: Insufficient documentation

## 2023-10-21 DIAGNOSIS — I890 Lymphedema, not elsewhere classified: Secondary | ICD-10-CM | POA: Insufficient documentation

## 2023-10-21 DIAGNOSIS — R293 Abnormal posture: Secondary | ICD-10-CM | POA: Insufficient documentation

## 2023-10-21 NOTE — Therapy (Signed)
OUTPATIENT PHYSICAL THERAPY  UPPER EXTREMITY ONCOLOGY EVALUATION  Patient Name: Sarah Pace MRN: [id number] DOB:1956/02/17, 67 y.o., female Today's Date: 10/21/2023  END OF SESSION:  PT End of Session - 10/21/23 1107     Visit Number 3    Number of Visits 9    Date for PT Re-Evaluation 11/05/23    PT Start Time 1106    PT Stop Time 1152    PT Time Calculation (min) 46 min    Activity Tolerance Patient tolerated treatment well    Behavior During Therapy [facility] for tasks assessed/performed             Past Medical History:  Diagnosis Date   Breast cancer (HCC) 04/19/2023   Left Breast   Past Surgical History:  Procedure Laterality Date   BREAST BIOPSY Left 03/07/2023   MM LT BREAST BX W LOC DEV 1ST LESION IMAGE BX SPEC STEREO GUIDE 03/07/2023 [facility]   BREAST BIOPSY  04/18/2023   MM LT RADIOACTIVE SEED LOC MAMMO GUIDE 04/18/2023 [facility]   BREAST BIOPSY Right 02/08/2023   At the Dermatologist office, negative.   BREAST LUMPECTOMY WITH RADIOACTIVE SEED LOCALIZATION Left 04/19/2023   Procedure: LEFT BREAST LUMPECTOMY WITH RADIOACTIVE SEED LOCALIZATION;  Surgeon: Chevis Pretty III, MD;  Location: [facility];  Service: General;  Laterality: Left;   Patient Active Problem List   Diagnosis Date Noted   Ductal carcinoma in situ (DCIS) of left breast 05/07/2023    PCP: Sigmund Hazel, MD  REFERRING PROVIDER: Loa Socks, NP  REFERRING DIAG:  [phone] (ICD-10-CM) - Ductal carcinoma in situ (DCIS) of left breast I89.0 (ICD-10-CM) - Lymphedema of breast  THERAPY DIAG:  Lymphedema, not elsewhere classified  Disorder of the skin and subcutaneous tissue related to radiation, unspecified  Stiffness of left shoulder, not elsewhere classified  Abnormal posture  Ductal carcinoma in situ of left breast  ONSET DATE: 08/04/23  Rationale for Evaluation and Treatment: Rehabilitation  SUBJECTIVE:                                                                                                                                                                                            SUBJECTIVE STATEMENT:It feels better after I exercise or do the massage. The compression bra is fine.  PERTINENT HISTORY: 03/07/2023 -Left breast biopsy: Atypical ductal hyperplasia bordering on DCIS; 04/19/2023 - Left lumpectomy: Intermediate grade DCIS with necrosis 5 mm, margins negative, ER 90%, PR 30% 04/2023 - Tamoxifen x 5 years;  06/04/2023 - 07/01/2023 - pt completed radiation  PAIN:  Are you having pain? Yes NPRS scale: 1-2/10 Pain location: dull  pain in L breast Pain orientation: Left  PAIN TYPE: dull Pain description: constant  Aggravating factors: moving  Relieving factors: wearing bra  PRECAUTIONS: Other: L breast lymphedema  RED FLAGS: None   WEIGHT BEARING RESTRICTIONS: No  FALLS:  Has patient fallen in last 6 months? 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  COGNITION: Overall cognitive status: Within functional limits for tasks assessed   PALPATION: Increased fluid palpable especially in L lateral trunk and inferior breast  OBSERVATIONS / OTHER ASSESSMENTS: fullness in L breast but especially L lateral trunk and inferior axilla  POSTURE: forward head, rounded shoulders  UPPER EXTREMITY AROM/PROM:  A/PROM RIGHT   eval   Shoulder extension 72  Shoulder flexion 155  Shoulder abduction 157   Shoulder internal rotation 56 old injury  Shoulder external rotation 84    (Blank rows = not tested)  A/PROM LEFT   eval  Shoulder extension 75  Shoulder flexion 160  Shoulder abduction 159  Shoulder internal rotation 67  Shoulder external rotation 89    (Blank rows = not tested)  CERVICAL AROM: All within normal limits:    Percent limited  Flexion   Extension   Right lateral flexion   Left lateral flexion   Right rotation   Left rotation     UPPER EXTREMITY STRENGTH:   LYMPHEDEMA ASSESSMENTS:   SURGERY TYPE/DATE: 04/19/23  NUMBER OF LYMPH NODES REMOVED: 0  CHEMOTHERAPY: none  RADIATION:completed 07/01/23    LYMPHEDEMA ASSESSMENTS:   LANDMARK RIGHT  eval  At axilla  29.5  15 cm proximal to olecranon process 28  10 cm proximal to olecranon process 27  Olecranon process 23.6  15 cm proximal to ulnar styloid process 22.3  10 cm proximal to ulnar styloid process 18.7  Just proximal to ulnar styloid process 14.2  Across hand at thumb web space 18.5  At base of 2nd digit 5.8  (Blank rows = not tested)  LANDMARK LEFT  eval  At axilla  30.4  15 cm proximal to olecranon process 29  10 cm proximal to olecranon process 27.9  Olecranon process 23.9  15 cm proximal to ulnar styloid process 21.5  10 cm proximal to ulnar styloid process 18.6  Just proximal to ulnar styloid process 14.1  Across hand at thumb web space 18.5  At base of 2nd digit 5.5  (Blank rows = not tested)   QUICK DASH SURVEY:    BREAST COMPLAINTS SCALE: 47   TODAY'S TREATMENT:  DATE:  10/21/23: MLD as follows in supine: short neck, R axillary nodes, L axillary nodes, establishment of interaxillary pathway, L breast moving fluid towards pathway and towards L axillary nodes (all nodes intact) and L UE working proximal to distal then retracing all steps  moving fluid towards L axillary nodes and interaxillary pathway then to R sidelying to work on L posterior upper quadrant moving fluid towards L and R axillary nodes then back to supine retracing steps.  10/15/23: MLD as follows in supine: short neck, R axillary nodes, L axillary nodes, establishment of interaxillary pathway, L breast moving fluid towards pathway and towards L axillary nodes (all nodes intact) and L UE working proximal to distal then retracing all steps moving fluid towards L axillary nodes and interaxillary pathway. Educated pt throughout in anatomy and physiology of the lymphatic system and principles of MLD.  Demonstrated doorway and corner stretch and had pt return demonstrate - educated pt to hold stretch for 60 sec  10/08/23: PROM to L shoulder to end range flexion and abduction while performing STM to L pec to decrease tightness; created foam chip pack for pt to wear in bra against lateral trunk to decrease swelling and also 1/2 inch grey foam in thick stockinette for pt to alternate with foam. Issued script for pt to obtain a compression bra from 2nd to [employer]    PATIENT EDUCATION:  Education details: MLD to L breast and UE Person educated: Patient Education method: Explanation Education comprehension: verbalized understanding  HOME EXERCISE PROGRAM: Obtain compression bra Wear foam or chip pack at L lateral trunk  ASSESSMENT:  CLINICAL IMPRESSION:  Continue MLD to L breast and LUE today. Used L axillary nodes and R axillary nodes since no nodes were removed. Added posterior L upper quadrant since there is some fullness at posterior L shoulder.   OBJECTIVE IMPAIRMENTS: decreased knowledge of condition, decreased knowledge of use of DME, decreased ROM, decreased strength, increased edema, increased fascial restrictions, impaired UE functional use, postural dysfunction, and pain.   ACTIVITY LIMITATIONS: carrying, lifting, and reach over head  PARTICIPATION  LIMITATIONS:  none  PERSONAL FACTORS:  none  are also affecting patient's functional outcome.   REHAB POTENTIAL: Good  CLINICAL DECISION MAKING: Stable/uncomplicated  EVALUATION COMPLEXITY: Low  GOALS: Goals reviewed with patient? Yes  SHORT TERM GOALS=LONG TERM GOALS Target date: 11/05/23  Pt will be independent in self MLD for long term management of lymphedema. Baseline: Goal status: INITIAL  2.  Pt will obtain a compression bra for long term management of lymphedema. Baseline:  Goal status: INITIAL  3.  Pt will report a 75% improvement in pain and tightness in L upper quadrant to allow improved comfort. Baseline:  Goal status: INITIAL  4.  Pt will be independent in a home exercise program for improving posture and UE strength.  Baseline:  Goal status: INITIAL  5.  Pt will report at least a 25% improvement in L lateral trunk and breast swelling to decrease risk of infection.  Baseline:  Goal status: INITIAL   PLAN:  PT FREQUENCY: 2x/week  PT DURATION: 4 weeks  PLANNED INTERVENTIONS: 97164- PT Re-evaluation, 97110-Therapeutic exercises, 97530- Therapeutic activity, 97535- Self Care, 16109- Manual therapy, [id number]- Orthotic Fit/training, Patient/Family education, Joint mobilization, Therapeutic exercises, Therapeutic activity, and Self Care  PLAN FOR NEXT SESSION: continue MLD to L breast and lateral trunk, give supine scap in the next visit or so   [employer], PT 10/21/2023, 11:54 AM

## 2023-10-25 ENCOUNTER — Ambulatory Visit: Payer: Medicare Other

## 2023-10-25 DIAGNOSIS — L599 Disorder of the skin and subcutaneous tissue related to radiation, unspecified: Secondary | ICD-10-CM

## 2023-10-25 DIAGNOSIS — D0512 Intraductal carcinoma in situ of left breast: Secondary | ICD-10-CM

## 2023-10-25 DIAGNOSIS — M25612 Stiffness of left shoulder, not elsewhere classified: Secondary | ICD-10-CM

## 2023-10-25 DIAGNOSIS — I890 Lymphedema, not elsewhere classified: Secondary | ICD-10-CM | POA: Diagnosis not present

## 2023-10-25 DIAGNOSIS — R293 Abnormal posture: Secondary | ICD-10-CM

## 2023-10-25 NOTE — Therapy (Signed)
OUTPATIENT PHYSICAL THERAPY  UPPER EXTREMITY ONCOLOGY EVALUATION  Patient Name: Sarah Pace MRN: [id number] DOB:04-May-1956, 67 y.o., female Today's Date: 10/25/2023  END OF SESSION:  PT End of Session - 10/25/23 1102     Visit Number 4    Number of Visits 9    Date for PT Re-Evaluation 11/05/23    PT Start Time 1104    PT Stop Time 1200    PT Time Calculation (min) 56 min    Activity Tolerance Patient tolerated treatment well    Behavior During Therapy [facility] for tasks assessed/performed             Past Medical History:  Diagnosis Date   Breast cancer (HCC) 04/19/2023   Left Breast   Past Surgical History:  Procedure Laterality Date   BREAST BIOPSY Left 03/07/2023   MM LT BREAST BX W LOC DEV 1ST LESION IMAGE BX SPEC STEREO GUIDE 03/07/2023 [facility]   BREAST BIOPSY  04/18/2023   MM LT RADIOACTIVE SEED LOC MAMMO GUIDE 04/18/2023 [facility]   BREAST BIOPSY Right 02/08/2023   At the Dermatologist office, negative.   BREAST LUMPECTOMY WITH RADIOACTIVE SEED LOCALIZATION Left 04/19/2023   Procedure: LEFT BREAST LUMPECTOMY WITH RADIOACTIVE SEED LOCALIZATION;  Surgeon: Chevis Pretty III, MD;  Location: [facility];  Service: General;  Laterality: Left;   Patient Active Problem List   Diagnosis Date Noted   Ductal carcinoma in situ (DCIS) of left breast 05/07/2023    PCP: Sigmund Hazel, MD  REFERRING PROVIDER: Loa Socks, NP  REFERRING DIAG:  [phone] (ICD-10-CM) - Ductal carcinoma in situ (DCIS) of left breast I89.0 (ICD-10-CM) - Lymphedema of breast  THERAPY DIAG:  Lymphedema, not elsewhere classified  Disorder of the skin and subcutaneous tissue related to radiation, unspecified  Stiffness of left shoulder, not elsewhere classified  Abnormal posture  Ductal carcinoma in situ of left breast  ONSET DATE: 08/04/23  Rationale for Evaluation and Treatment: Rehabilitation  SUBJECTIVE:                                                                                                                                                                                            SUBJECTIVE STATEMENT:  I am doing the exercises and the massage. My swelling is up and down. I don't think it changes a lot. I have been wearing compression day and night.Do I need to continue coming if all we ae doing is the massage?  PERTINENT HISTORY: 03/07/2023 -Left breast biopsy: Atypical ductal hyperplasia bordering on DCIS; 04/19/2023 - Left lumpectomy: Intermediate grade DCIS with necrosis 5 mm, margins negative, ER 90%, PR  30% 04/2023 - Tamoxifen x 5 years;  06/04/2023 - 07/01/2023 - pt completed radiation  PAIN:  Are you having pain? Yes NPRS scale: 1-2/10 Pain location: dull pain in L breast Pain orientation: Left  PAIN TYPE: dull Pain description: constant  Aggravating factors: moving  Relieving factors: wearing bra  PRECAUTIONS: Other: L breast lymphedema  RED FLAGS: None   WEIGHT BEARING RESTRICTIONS: No  FALLS:  Has patient fallen in last 6 months? 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  COGNITION: Overall cognitive status: Within functional limits for tasks assessed   PALPATION: Increased fluid palpable especially in L lateral trunk and inferior breast  OBSERVATIONS / OTHER ASSESSMENTS: fullness in L breast but especially L lateral trunk and inferior axilla  POSTURE: forward head, rounded  shoulders  UPPER EXTREMITY AROM/PROM:  A/PROM RIGHT   eval   Shoulder extension 72  Shoulder flexion 155  Shoulder abduction 157  Shoulder internal rotation 56 old injury  Shoulder external rotation 84    (Blank rows = not tested)  A/PROM LEFT   eval  Shoulder extension 75  Shoulder flexion 160  Shoulder abduction 159  Shoulder internal rotation 67  Shoulder external rotation 89    (Blank rows = not tested)  CERVICAL AROM: All within normal limits:    Percent limited  Flexion   Extension   Right lateral flexion   Left lateral flexion   Right rotation   Left rotation     UPPER EXTREMITY STRENGTH:   LYMPHEDEMA ASSESSMENTS:   SURGERY TYPE/DATE: 04/19/23  NUMBER OF LYMPH NODES REMOVED: 0  CHEMOTHERAPY: none  RADIATION:completed 07/01/23    LYMPHEDEMA ASSESSMENTS:   LANDMARK RIGHT  eval  At axilla  29.5  15 cm proximal to olecranon process 28  10 cm proximal to olecranon process 27  Olecranon process 23.6  15 cm proximal to ulnar styloid process 22.3  10 cm proximal to ulnar styloid process 18.7  Just proximal to ulnar styloid process 14.2  Across hand at thumb web space 18.5  At base of 2nd digit 5.8  (Blank rows = not tested)  LANDMARK LEFT  eval  At axilla  30.4  15 cm proximal to olecranon process 29  10 cm proximal to olecranon process 27.9  Olecranon process 23.9  15 cm proximal to ulnar styloid process 21.5  10 cm proximal to ulnar styloid process 18.6  Just proximal to ulnar styloid process 14.1  Across hand at thumb web space 18.5  At base of 2nd digit 5.5  (Blank rows = not tested)   QUICK DASH SURVEY:    BREAST COMPLAINTS SCALE: 47   TODAY'S TREATMENT:  DATE:   10/25/2023 Educated pt in self MLD to the left breast utilizing bilateral axillary LN's.MLD as follows in supine: short neck, R  axillary nodes, L axillary nodes, establishment of interaxillary pathway, L breast moving fluid towards pathway and towards L axillary nodes (all nodes intact) and R sidelying to work on L posterior upper quadrant moving fluid towards L and R axillary nodes then back to supine retracing steps Therapist performed steps first and pt repeated all steps. Therapist performed all work across the back, however, pt practiced some in SL to left axilla.  Pt required VC's and TC's initially, but did very well overall. STM to left pectorals, UT, lateral trunk with cocoa butter; multiple shortened areas noted in pecs and lateral trunk with mild tenderness.Educated pt how she can massage pectorals in stargazer position on Left. PROM left shoulder /Flex, abd, ER. Had pt try abd wall stretch but had shoulder pain so held. Pt given handout for breast MLD , and post op exercise sheet with stargazer stretch for HEP. Requested pt try gfoam in bra at lateral trunk for swelling, and chip pack at medial fibrosis at night to see if it helps. Pt prefers limited visits.   10/21/23: MLD as follows in supine: short neck, R axillary nodes, L axillary nodes, establishment of interaxillary pathway, L breast moving fluid towards pathway and towards L axillary nodes (all nodes intact) and L UE working proximal to distal then retracing all steps moving fluid towards L axillary nodes and interaxillary pathway then to R sidelying to work on L posterior upper quadrant moving fluid towards L and R axillary nodes then back to supine retracing steps.  10/15/23: MLD as follows in supine: short neck, R axillary nodes, L axillary nodes, establishment of interaxillary pathway, L breast moving fluid towards pathway and towards L axillary nodes (all nodes intact) and L UE working proximal to distal then retracing all steps moving fluid towards L axillary nodes and interaxillary pathway. Educated pt throughout in anatomy and physiology of the lymphatic  system and principles of MLD.  Demonstrated doorway and corner stretch and had pt return demonstrate - educated pt to hold stretch for 60 sec  10/08/23: PROM to L shoulder to end range flexion and abduction while performing STM to L pec to decrease tightness; created foam chip pack for pt to wear in bra against lateral trunk to decrease swelling and also 1/2 inch grey foam in thick stockinette for pt to alternate with foam. Issued script for pt to obtain a compression bra from 2nd to [employer]    PATIENT EDUCATION:  Education details: MLD to L breast and UE Person educated: Patient Education method: Explanation Education comprehension: verbalized understanding  HOME EXERCISE PROGRAM: Obtain compression bra Wear foam or chip pack at L lateral trunk  ASSESSMENT:  CLINICAL IMPRESSION:  Continued MLD and instructed pt in all MLD techniques directing toward axillary LN's as per previous instruction. Pt did very well overall but will require review.Multiple shortened areas noted in clavicular and axillary border of pectorals. Pt wants limited visits.   OBJECTIVE IMPAIRMENTS: decreased knowledge of condition, decreased knowledge of use of DME, decreased ROM, decreased strength, increased edema, increased fascial restrictions, impaired UE functional use, postural dysfunction, and pain.   ACTIVITY LIMITATIONS: carrying, lifting, and reach over head  PARTICIPATION LIMITATIONS:  none  PERSONAL FACTORS:  none  are also affecting patient's functional outcome.   REHAB POTENTIAL: Good  CLINICAL DECISION MAKING: Stable/uncomplicated  EVALUATION COMPLEXITY: Low  GOALS: Goals reviewed  with patient? Yes  SHORT TERM GOALS=LONG TERM GOALS Target date: 11/05/23  Pt will be independent in self MLD for long term management of lymphedema. Baseline: Goal status: INITIAL  2.  Pt will obtain a compression bra for long term management of lymphedema. Baseline:  Goal status:MET 10/25/2023 3.  Pt will  report a 75% improvement in pain and tightness in L upper quadrant to allow improved comfort. Baseline:  Goal status: INITIAL  4.  Pt will be independent in a home exercise program for improving posture and UE strength.  Baseline:  Goal status: INITIAL  5.  Pt will report at least a 25% improvement in L lateral trunk and breast swelling to decrease risk of infection.  Baseline:  Goal status: INITIAL   PLAN:  PT FREQUENCY: 2x/week  PT DURATION: 4 weeks  PLANNED INTERVENTIONS: 97164- PT Re-evaluation, 97110-Therapeutic exercises, 97530- Therapeutic activity, 97535- Self Care, 09811- Manual therapy, 97760- Orthotic Fit/training, Patient/Family education, Joint mobilization, Therapeutic exercises, Therapeutic activity, and Self Care  PLAN FOR NEXT SESSION: Review MLD to L breast and lateral trunk with pt,, give supine scap in the next visit or so, STM pectorals prn, Pt. May want limited visits.  Waynette Buttery, PT 10/25/2023, 12:44 PM

## 2023-10-29 ENCOUNTER — Encounter: Payer: Medicare Other | Admitting: Physical Therapy

## 2023-10-30 IMAGING — MG MM DIGITAL SCREENING BILAT W/ TOMO AND CAD
8 series · 8 of 24 positions shown · non-contrast
Comparison: Previous exam(s).

CLINICAL DATA: Screening.

EXAM:
DIGITAL SCREENING BILATERAL MAMMOGRAM WITH TOMOSYNTHESIS AND CAD
TECHNIQUE: Bilateral screening digital craniocaudal and mediolateral oblique
mammograms were obtained. Bilateral screening digital breast
tomosynthesis was performed. The images were evaluated with
computer-aided detection.

[L CC synth-2D]
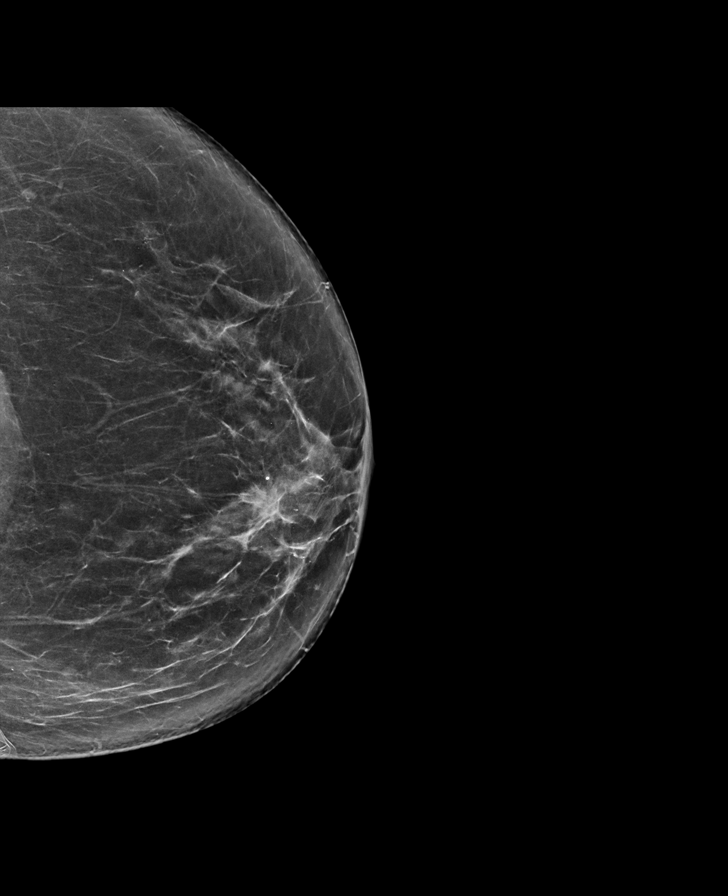

[L MLO synth-2D]
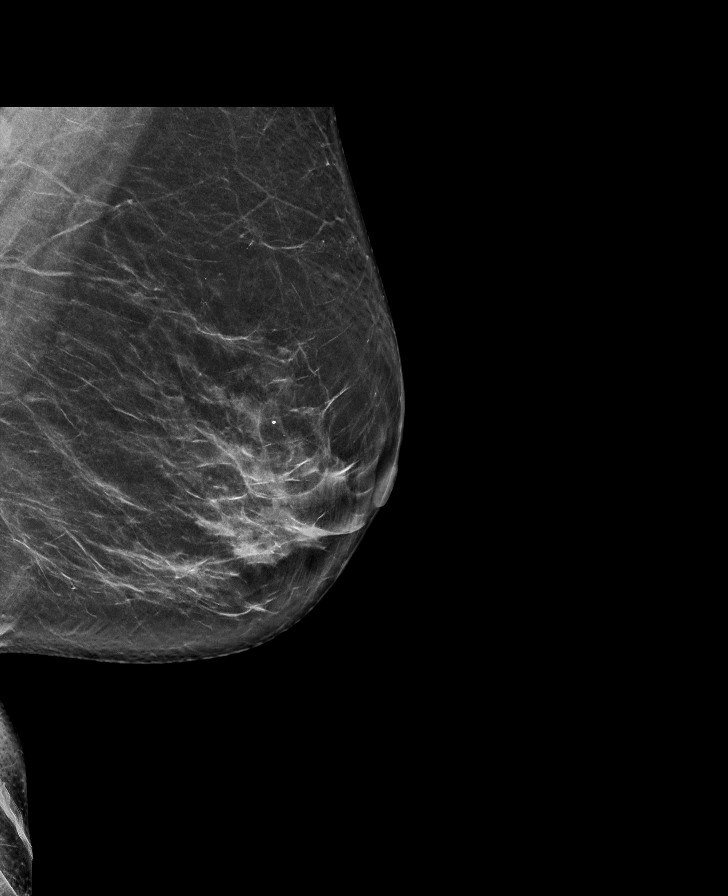

[R MLO synth-2D]
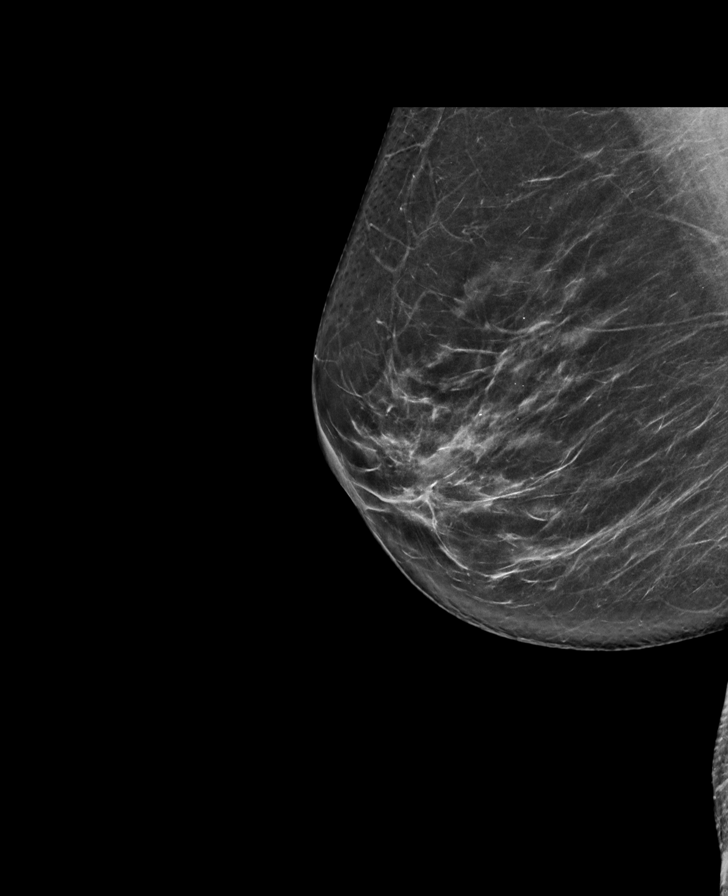

[R CC synth-2D]
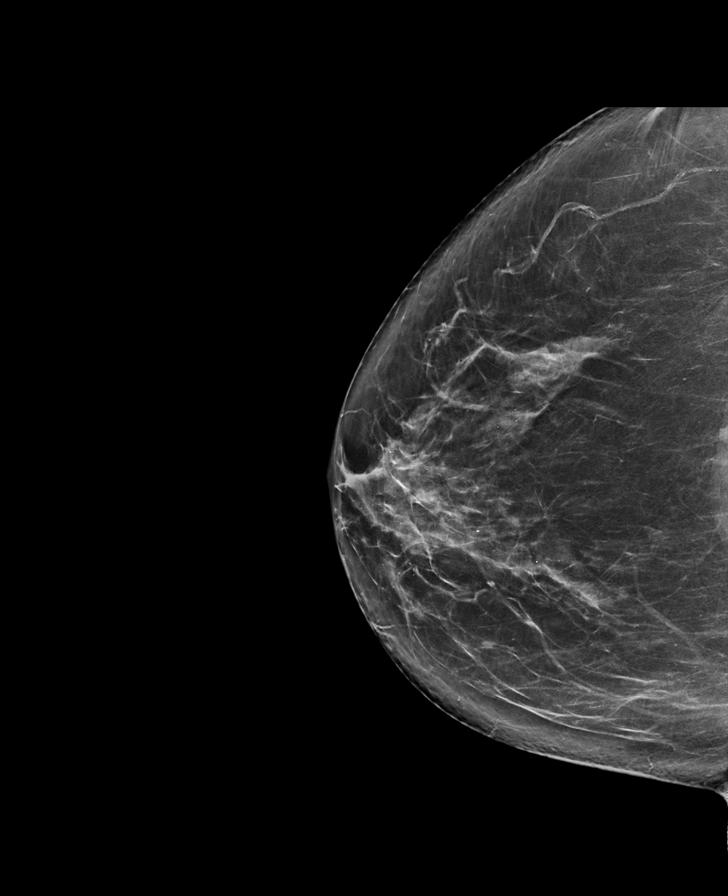

[L MLO tomo · tomo slice 41/81.0]
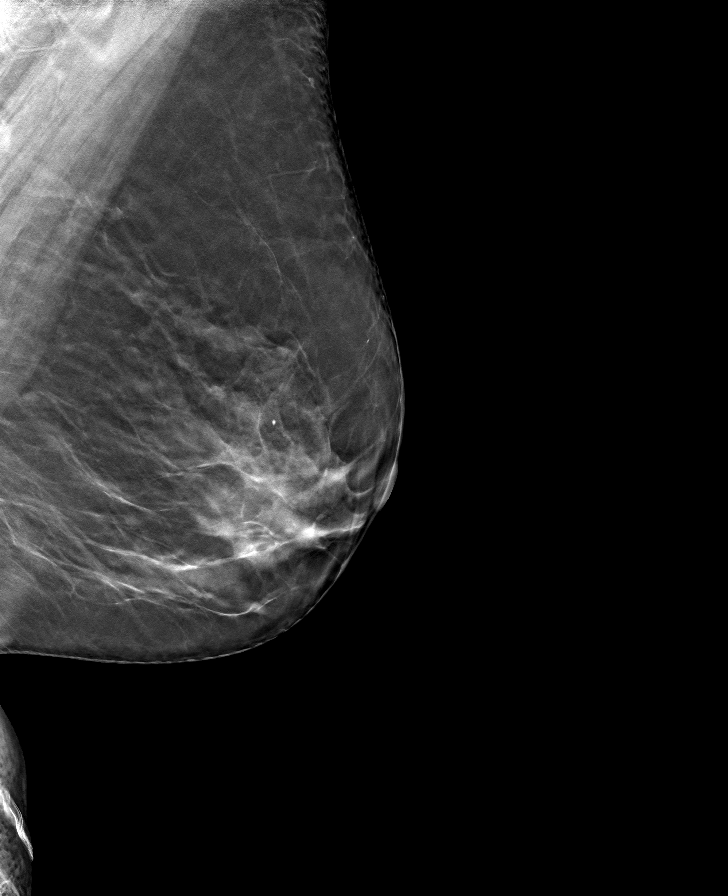

[R CC tomo · tomo slice 41/80.0]
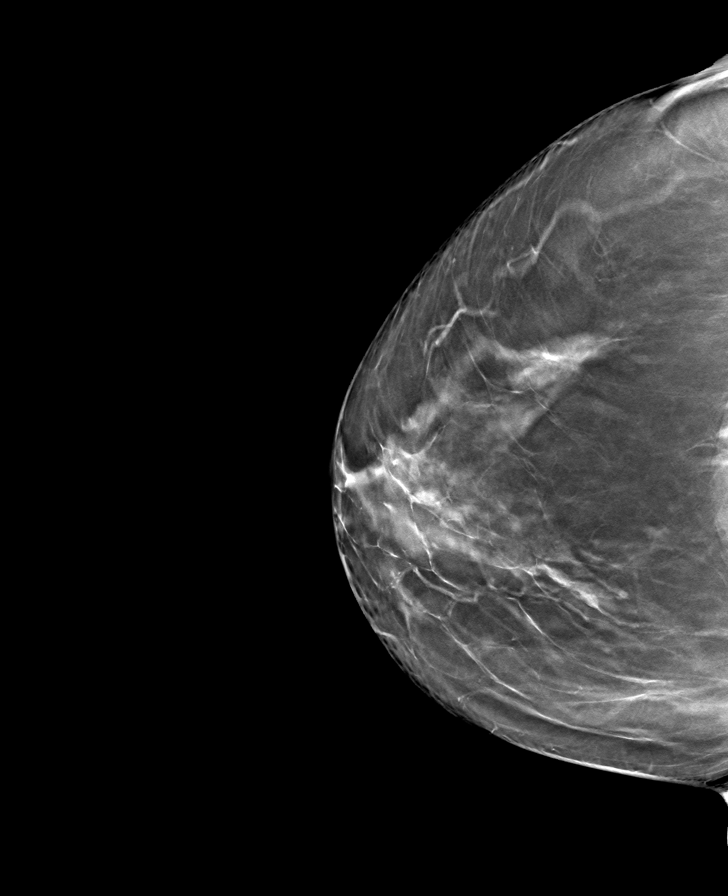

[R MLO tomo · tomo slice 39/77.0]
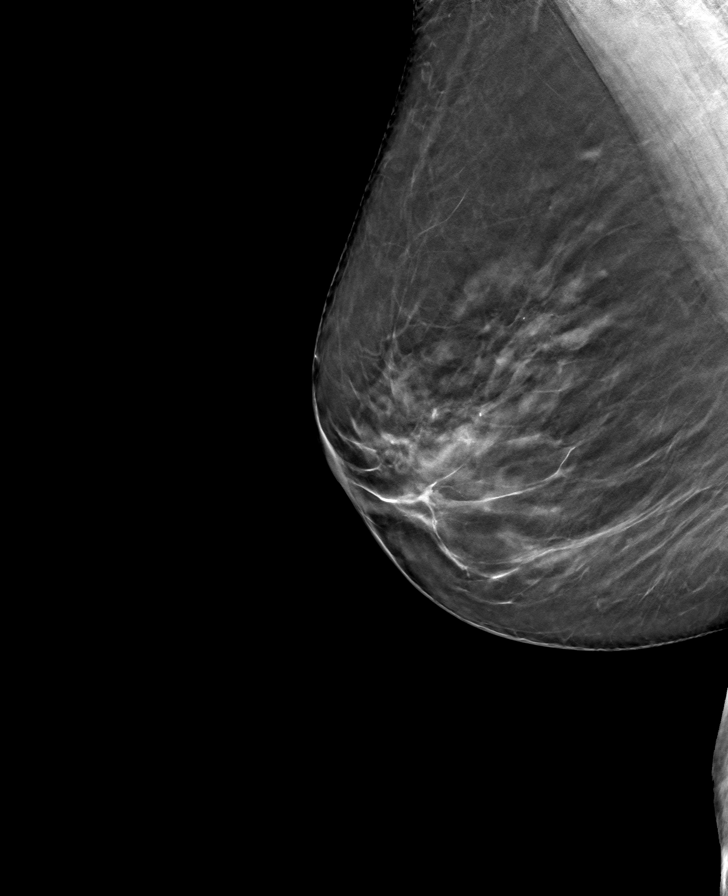

[L CC tomo · tomo slice 39/76.0]
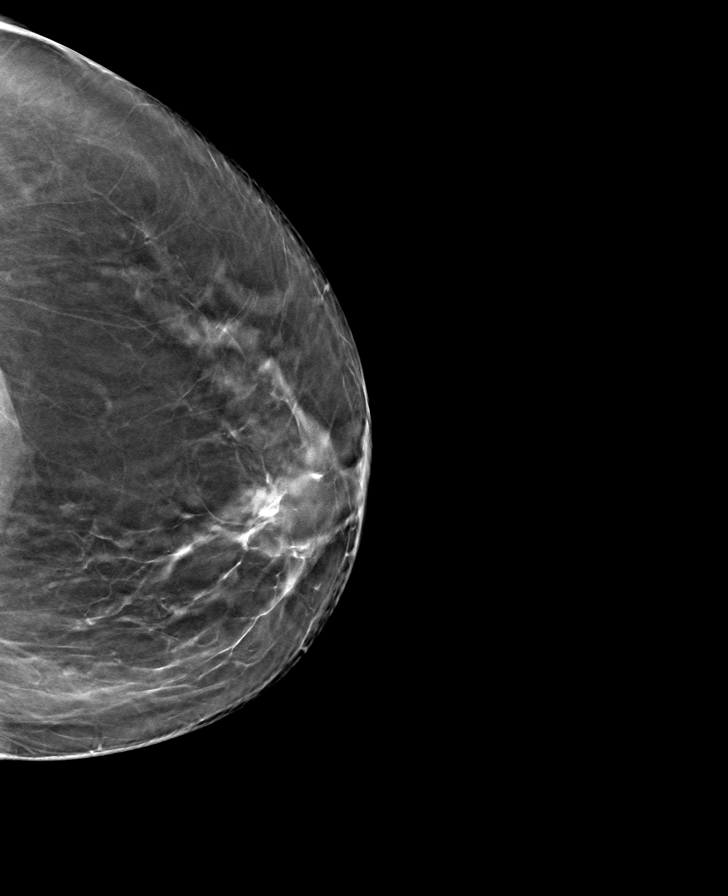

[8 of 24 positions shown; findings below may reference images not displayed]

ACR Breast Density Category b: There are scattered areas of
fibroglandular density.
FINDINGS: There are no findings suspicious for malignancy.
IMPRESSION: No mammographic evidence of malignancy. A result letter of this
screening mammogram will be mailed directly to the patient.

RECOMMENDATION:
Screening mammogram in one year. (Code:51-O-LD2)

BI-RADS CATEGORY  1: Negative.

## 2023-10-31 ENCOUNTER — Ambulatory Visit: Payer: Medicare Other | Admitting: Physical Therapy

## 2023-10-31 ENCOUNTER — Encounter: Payer: Self-pay | Admitting: Physical Therapy

## 2023-10-31 DIAGNOSIS — R293 Abnormal posture: Secondary | ICD-10-CM | POA: Diagnosis not present

## 2023-10-31 DIAGNOSIS — D0512 Intraductal carcinoma in situ of left breast: Secondary | ICD-10-CM

## 2023-10-31 DIAGNOSIS — M25612 Stiffness of left shoulder, not elsewhere classified: Secondary | ICD-10-CM | POA: Diagnosis not present

## 2023-10-31 DIAGNOSIS — L599 Disorder of the skin and subcutaneous tissue related to radiation, unspecified: Secondary | ICD-10-CM | POA: Diagnosis not present

## 2023-10-31 DIAGNOSIS — I890 Lymphedema, not elsewhere classified: Secondary | ICD-10-CM | POA: Diagnosis not present

## 2023-10-31 NOTE — Therapy (Signed)
OUTPATIENT PHYSICAL THERAPY  UPPER EXTREMITY ONCOLOGY EVALUATION  Patient Name: Sarah Pace MRN: [id number] DOB:[birth year], 67 y.o., female Today's Date: 10/31/2023  END OF SESSION:  PT End of Session - 10/31/23 0803     Visit Number 5    Number of Visits 9    Date for PT Re-Evaluation 11/05/23    PT Start Time 0802    PT Stop Time 0848    PT Time Calculation (min) 46 min    Activity Tolerance Patient tolerated treatment well    Behavior During Therapy [facility] for tasks assessed/performed             Past Medical History:  Diagnosis Date   Breast cancer (HCC) 04/19/2023   Left Breast   Past Surgical History:  Procedure Laterality Date   BREAST BIOPSY Left 03/07/2023   MM LT BREAST BX W LOC DEV 1ST LESION IMAGE BX SPEC STEREO GUIDE 03/07/2023 [facility]   BREAST BIOPSY  04/18/2023   MM LT RADIOACTIVE SEED LOC MAMMO GUIDE 04/18/2023 [facility]   BREAST BIOPSY Right 02/08/2023   At the Dermatologist office, negative.   BREAST LUMPECTOMY WITH RADIOACTIVE SEED LOCALIZATION Left 04/19/2023   Procedure: LEFT BREAST LUMPECTOMY WITH RADIOACTIVE SEED LOCALIZATION;  Surgeon: Chevis Pretty III, MD;  Location: [facility];  Service: General;  Laterality: Left;   Patient Active Problem List   Diagnosis Date Noted   Ductal carcinoma in situ (DCIS) of left breast 05/07/2023    PCP: Sigmund Hazel, MD  REFERRING PROVIDER: Loa Socks, NP  REFERRING DIAG:  [phone] (ICD-10-CM) - Ductal carcinoma in situ (DCIS) of left breast I89.0 (ICD-10-CM) - Lymphedema of breast  THERAPY DIAG:  Lymphedema, not elsewhere classified  Disorder of the skin and subcutaneous tissue related to radiation, unspecified  Stiffness of left shoulder, not elsewhere classified  Abnormal posture  Ductal carcinoma in situ of left breast  ONSET DATE: 08/04/23  Rationale for Evaluation and Treatment: Rehabilitation  SUBJECTIVE:                                                                                                                                                                                            SUBJECTIVE STATEMENT:  I have been doing the self massage. I can not tell that the swelling has gone down. I can tell when I lift the baby more I have more swelling.   PERTINENT HISTORY: 03/07/2023 -Left breast biopsy: Atypical ductal hyperplasia bordering on DCIS; 04/19/2023 - Left lumpectomy: Intermediate grade DCIS with necrosis 5 mm, margins negative, ER 90%, PR 30% 04/2023 - Tamoxifen x 5 years;  06/04/2023 - 07/01/2023 -  pt completed radiation  PAIN:  Are you having pain? No  PRECAUTIONS: Other: L breast lymphedema  RED FLAGS: None   WEIGHT BEARING RESTRICTIONS: No  FALLS:  Has patient fallen in last 6 months? 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  COGNITION: Overall cognitive status: Within functional limits for tasks assessed   PALPATION: Increased fluid palpable especially in L lateral trunk and inferior breast  OBSERVATIONS / OTHER ASSESSMENTS: fullness in L breast but especially L lateral trunk and inferior axilla  POSTURE: forward head, rounded shoulders  UPPER EXTREMITY AROM/PROM:  A/PROM RIGHT   eval   Shoulder extension 72  Shoulder flexion 155  Shoulder abduction 157  Shoulder internal rotation 33 old injury  Shoulder external rotation 84    (Blank rows = not tested)  A/PROM LEFT    eval  Shoulder extension 75  Shoulder flexion 160  Shoulder abduction 159  Shoulder internal rotation 67  Shoulder external rotation 89    (Blank rows = not tested)  CERVICAL AROM: All within normal limits:    Percent limited  Flexion   Extension   Right lateral flexion   Left lateral flexion   Right rotation   Left rotation     UPPER EXTREMITY STRENGTH:   LYMPHEDEMA ASSESSMENTS:   SURGERY TYPE/DATE: 04/19/23  NUMBER OF LYMPH NODES REMOVED: 0  CHEMOTHERAPY: none  RADIATION:completed 07/01/23    LYMPHEDEMA ASSESSMENTS:   LANDMARK RIGHT  eval  At axilla  29.5  15 cm proximal to olecranon process 28  10 cm proximal to olecranon process 27  Olecranon process 23.6  15 cm proximal to ulnar styloid process 22.3  10 cm proximal to ulnar styloid process 18.7  Just proximal to ulnar styloid process 14.2  Across hand at thumb web space 18.5  At base of 2nd digit 5.8  (Blank rows = not tested)  LANDMARK LEFT  eval  At axilla  30.4  15 cm proximal to olecranon process 29  10 cm proximal to olecranon process 27.9  Olecranon process 23.9  15 cm proximal to ulnar styloid process 21.5  10 cm proximal to ulnar styloid process 18.6  Just proximal to ulnar styloid process 14.1  Across hand at thumb web space 18.5  At base of 2nd digit 5.5  (Blank rows = not tested)   QUICK DASH SURVEY:    BREAST COMPLAINTS SCALE: 47   TODAY'S TREATMENT:  DATE:  10/31/23: Assessed pt's progress towards goals  Focused on upper quadrant especially pec tightness today: Supine over foam roll with pt returning therapist demo for each: alternating flexion, bilateral scaption, snow angels - all x 10 each with v/c for correct form Marjo Bicker pose walking fingers to side to stretch L side x 30 sec holds Supine over small blue ball with knees bent and ball  between shoulder blades with hands interlaced behind head: leaning backwards over ball alternating elbows together and elbows apart x 10 reps Instructed pt in self massage with tennis ball on wall and had pt return demo pec release - leaning against tennis ball and taking 5 deep breaths - breathing in to ball x 3 - then educated in using it in a stockinette to massage back R Sidelying of foam roller (roller just below axilla) raising LUE up and overhead while tilting body backwards and holds 5 sec to open chest x 5 10/25/2023 Educated pt in self MLD to the left breast utilizing bilateral axillary LN's.MLD as follows in supine: short neck, R axillary nodes, L axillary nodes, establishment of interaxillary pathway, L breast moving fluid towards pathway and towards L axillary nodes (all nodes intact) and R sidelying to work on L posterior upper quadrant moving fluid towards L and R axillary nodes then back to supine retracing steps Therapist performed steps first and pt repeated all steps. Therapist performed all work across the back, however, pt practiced some in SL to left axilla.  Pt required VC's and TC's initially, but did very well overall. STM to left pectorals, UT, lateral trunk with cocoa butter; multiple shortened areas noted in pecs and lateral trunk with mild tenderness.Educated pt how she can massage pectorals in stargazer position on Left. PROM left shoulder /Flex, abd, ER. Had pt try abd wall stretch but had shoulder pain so held. Pt given handout for breast MLD , and post op exercise sheet with stargazer stretch for HEP. Requested pt try gfoam in bra at lateral trunk for swelling, and chip pack at medial fibrosis at night to see if it helps. Pt prefers limited visits.   10/21/23: MLD as follows in supine: short neck, R axillary nodes, L axillary nodes, establishment of interaxillary pathway, L breast moving fluid towards pathway and towards L axillary nodes (all nodes intact) and L UE working  proximal to distal then retracing all steps moving fluid towards L axillary nodes and interaxillary pathway then to R sidelying to work on L posterior upper quadrant moving fluid towards L and R axillary nodes then back to supine retracing steps.  10/15/23: MLD as follows in supine: short neck, R axillary nodes, L axillary nodes, establishment of interaxillary pathway, L breast moving fluid towards pathway and towards L axillary nodes (all nodes intact) and L UE working proximal to distal then retracing all steps moving fluid towards L axillary nodes and interaxillary pathway. Educated pt throughout in anatomy and physiology of the lymphatic system and principles of MLD.  Demonstrated doorway and corner stretch and had pt return demonstrate - educated pt to hold stretch for 60 sec  10/08/23: PROM to L shoulder to end range flexion and abduction while performing STM to L pec to decrease tightness; created foam chip pack for pt to wear in bra against lateral trunk to decrease swelling and also 1/2 inch grey foam in thick stockinette for pt to alternate with foam. Issued script for pt to obtain a compression bra from 2nd to [employer]  PATIENT EDUCATION:  Education details: MLD to L breast and UE Person educated: Patient Education method: Explanation Education comprehension: verbalized understanding  HOME EXERCISE PROGRAM: Obtain compression bra Wear foam or chip pack at L lateral trunk  Access Code: [email or web address] URL: [email or web address] Date: 10/31/2023 Prepared by: Leonette Most  Exercises - Supine Upper-Thoracic Mobilization   - 1 x daily - 7 x weekly - 1 sets - 15-30 reps - Supine Thoracic Mobilization Foam Roll Vertical  - 1 x daily - 7 x weekly - 1 sets - 10 reps - Child's Pose with Sidebending  - 1 x daily - 7 x weekly - 1 sets - 2-3 reps - 1-2 min hold - Latissimus Mobilization on Foam Roll  - 1 x daily - 7 x weekly - 1 sets - 2 reps - 1-3 min hold - Tennis Ball  Self Massage on Wall  - 1 x daily - 7 x weekly - 1 sets - 3-5 reps - 3-5 breaths or until muscles release hold ASSESSMENT:  CLINICAL IMPRESSION:  Assessed pt's progress towards goals in therapy. Pt feeling more confident with self MLD but still having L upper quadrant tightness. Focused today on educating pt in pec and trunk stretches to decrease tightness in L upper quadrant. Put all new exercises in a home exercise program for pt to do at home. Will focus on STM to L pec at next session.   OBJECTIVE IMPAIRMENTS: decreased knowledge of condition, decreased knowledge of use of DME, decreased ROM, decreased strength, increased edema, increased fascial restrictions, impaired UE functional use, postural dysfunction, and pain.   ACTIVITY LIMITATIONS: carrying, lifting, and reach over head  PARTICIPATION LIMITATIONS:  none  PERSONAL FACTORS:  none  are also affecting patient's functional outcome.   REHAB POTENTIAL: Good  CLINICAL DECISION MAKING: Stable/uncomplicated  EVALUATION COMPLEXITY: Low  GOALS: Goals reviewed with patient? Yes  SHORT TERM GOALS=LONG TERM GOALS Target date: 11/05/23  Pt will be independent in self MLD for long term management of lymphedema. Baseline: Goal status: ONGOING  2.  Pt will obtain a compression bra for long term management of lymphedema. Baseline:  Goal status:MET 10/25/2023  3.  Pt will report a 75% improvement in pain and tightness in L upper quadrant to allow improved comfort. Baseline:  Goal status: ONGOING 25% improved  4.  Pt will be independent in a home exercise program for improving posture and UE strength.  Baseline:  Goal status: INITIAL  5.  Pt will report at least a 25% improvement in L lateral trunk and breast swelling to decrease risk of infection.  Baseline:  Goal status: MET 10/31/23: 50% improved   PLAN:  PT FREQUENCY: 2x/week  PT DURATION: 4 weeks  PLANNED INTERVENTIONS: 97164- PT Re-evaluation, 97110-Therapeutic  exercises, 97530- Therapeutic activity, 97535- Self Care, 10272- Manual therapy, 97760- Orthotic Fit/training, Patient/Family education, Joint mobilization, Therapeutic exercises, Therapeutic activity, and Self Care  PLAN FOR NEXT SESSION: how are new exercises? Focus on STM to L pecs, Review MLD to L breast and lateral trunk with pt,, give supine scap in the next visit or so, STM pectorals prn, Pt. May want limited visits.  [facility], PT 10/31/2023, 8:56 AM

## 2023-11-05 ENCOUNTER — Ambulatory Visit: Payer: Medicare Other | Admitting: Physical Therapy

## 2023-11-05 ENCOUNTER — Encounter: Payer: Self-pay | Admitting: Physical Therapy

## 2023-11-05 DIAGNOSIS — R293 Abnormal posture: Secondary | ICD-10-CM

## 2023-11-05 DIAGNOSIS — L599 Disorder of the skin and subcutaneous tissue related to radiation, unspecified: Secondary | ICD-10-CM

## 2023-11-05 DIAGNOSIS — D0512 Intraductal carcinoma in situ of left breast: Secondary | ICD-10-CM

## 2023-11-05 DIAGNOSIS — I890 Lymphedema, not elsewhere classified: Secondary | ICD-10-CM

## 2023-11-05 DIAGNOSIS — M25612 Stiffness of left shoulder, not elsewhere classified: Secondary | ICD-10-CM | POA: Diagnosis not present

## 2023-11-05 NOTE — Therapy (Signed)
OUTPATIENT PHYSICAL THERAPY  UPPER EXTREMITY ONCOLOGY TREATMENT  Patient Name: Sarah Pace MRN: [id number] DOB:1956/07/29, 67 y.o., female Today's Date: 11/05/2023  END OF SESSION:  PT End of Session - 11/05/23 0842     Visit Number 6    Number of Visits 9    Date for PT Re-Evaluation 11/05/23    PT Start Time 0801    PT Stop Time 0845    PT Time Calculation (min) 44 min    Activity Tolerance Patient tolerated treatment well    Behavior During Therapy [facility] for tasks assessed/performed              Past Medical History:  Diagnosis Date   Breast cancer (HCC) 04/19/2023   Left Breast   Past Surgical History:  Procedure Laterality Date   BREAST BIOPSY Left 03/07/2023   MM LT BREAST BX W LOC DEV 1ST LESION IMAGE BX SPEC STEREO GUIDE 03/07/2023 [facility]   BREAST BIOPSY  04/18/2023   MM LT RADIOACTIVE SEED LOC MAMMO GUIDE 04/18/2023 [facility]   BREAST BIOPSY Right 02/08/2023   At the Dermatologist office, negative.   BREAST LUMPECTOMY WITH RADIOACTIVE SEED LOCALIZATION Left 04/19/2023   Procedure: LEFT BREAST LUMPECTOMY WITH RADIOACTIVE SEED LOCALIZATION;  Surgeon: Chevis Pretty III, MD;  Location: [facility];  Service: General;  Laterality: Left;   Patient Active Problem List   Diagnosis Date Noted   Ductal carcinoma in situ (DCIS) of left breast 05/07/2023    PCP: Sigmund Hazel, MD  REFERRING PROVIDER: Loa Socks, NP  REFERRING DIAG:  [phone] (ICD-10-CM) - Ductal carcinoma in situ (DCIS) of left breast I89.0 (ICD-10-CM) - Lymphedema of breast  THERAPY DIAG:  Lymphedema, not elsewhere classified  Disorder of the skin and subcutaneous tissue related to radiation, unspecified  Stiffness of left shoulder, not elsewhere classified  Abnormal posture  Ductal carcinoma in situ of left breast  ONSET DATE: 08/04/23  Rationale for Evaluation and Treatment: Rehabilitation  SUBJECTIVE:                                                                                                                                                                                            SUBJECTIVE STATEMENT:  I got the tennis balls and the foam roller. I feel like the chest is loosening up. It is not as tight. The swelling seems about the same. I am still doing the self MLD.   PERTINENT HISTORY: 03/07/2023 -Left breast biopsy: Atypical ductal hyperplasia bordering on DCIS; 04/19/2023 - Left lumpectomy: Intermediate grade DCIS with necrosis 5 mm, margins negative, ER 90%, PR 30% 04/2023 - Tamoxifen x  5 years;  06/04/2023 - 07/01/2023 - pt completed radiation  PAIN:  Are you having pain? No  PRECAUTIONS: Other: L breast lymphedema  RED FLAGS: None   WEIGHT BEARING RESTRICTIONS: No  FALLS:  Has patient fallen in last 6 months? 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  COGNITION: Overall cognitive status: Within functional limits for tasks assessed   PALPATION: Increased fluid palpable especially in L lateral trunk and inferior breast  OBSERVATIONS / OTHER ASSESSMENTS: fullness in L breast but especially L lateral trunk and inferior axilla  POSTURE: forward head, rounded shoulders  UPPER EXTREMITY AROM/PROM:  A/PROM RIGHT   eval   Shoulder extension 72  Shoulder flexion 155  Shoulder abduction 157  Shoulder internal rotation 86 old injury  Shoulder external rotation 84    (Blank rows = not  tested)  A/PROM LEFT   eval  Shoulder extension 75  Shoulder flexion 160  Shoulder abduction 159  Shoulder internal rotation 67  Shoulder external rotation 89    (Blank rows = not tested)  CERVICAL AROM: All within normal limits:    Percent limited  Flexion   Extension   Right lateral flexion   Left lateral flexion   Right rotation   Left rotation     UPPER EXTREMITY STRENGTH:   LYMPHEDEMA ASSESSMENTS:   SURGERY TYPE/DATE: 04/19/23  NUMBER OF LYMPH NODES REMOVED: 0  CHEMOTHERAPY: none  RADIATION:completed 07/01/23    LYMPHEDEMA ASSESSMENTS:   LANDMARK RIGHT  eval  At axilla  29.5  15 cm proximal to olecranon process 28  10 cm proximal to olecranon process 27  Olecranon process 23.6  15 cm proximal to ulnar styloid process 22.3  10 cm proximal to ulnar styloid process 18.7  Just proximal to ulnar styloid process 14.2  Across hand at thumb web space 18.5  At base of 2nd digit 5.8  (Blank rows = not tested)  LANDMARK LEFT  eval  At axilla  30.4  15 cm proximal to olecranon process 29  10 cm proximal to olecranon process 27.9  Olecranon process 23.9  15 cm proximal to ulnar styloid process 21.5  10 cm proximal to ulnar styloid process 18.6  Just proximal to ulnar styloid process 14.1  Across hand at thumb web space 18.5  At base of 2nd digit 5.5  (Blank rows = not tested)   QUICK DASH SURVEY:    BREAST COMPLAINTS SCALE: 47   TODAY'S TREATMENT:  DATE:  11/05/23: Spero Geralds using WAVE tool and cocoa butter to L serratus and lats in area of trigger points with multiple trigger points noted and ropey texture but improved by end of session Sidelying over foam roll on L side to see if pt is now able to tolerate after STM since she has been unable to home - did both sides with no difficulty noted  10/31/23: Assessed pt's  progress towards goals  Focused on upper quadrant especially pec tightness today: Supine over foam roll with pt returning therapist demo for each: alternating flexion, bilateral scaption, snow angels - all x 10 each with v/c for correct form Marjo Bicker pose walking fingers to side to stretch L side x 30 sec holds Supine over small blue ball with knees bent and ball between shoulder blades with hands interlaced behind head: leaning backwards over ball alternating elbows together and elbows apart x 10 reps Instructed pt in self massage with tennis ball on wall and had pt return demo pec release - leaning against tennis ball and taking 5 deep breaths - breathing in to ball x 3 - then educated in using it in a stockinette to massage back R Sidelying of foam roller (roller just below axilla) raising LUE up and overhead while tilting body backwards and holds 5 sec to open chest x 5 10/25/2023 Educated pt in self MLD to the left breast utilizing bilateral axillary LN's.MLD as follows in supine: short neck, R axillary nodes, L axillary nodes, establishment of interaxillary pathway, L breast moving fluid towards pathway and towards L axillary nodes (all nodes intact) and R sidelying to work on L posterior upper quadrant moving fluid towards L and R axillary nodes then back to supine retracing steps Therapist performed steps first and pt repeated all steps. Therapist performed all work across the back, however, pt practiced some in SL to left axilla.  Pt required VC's and TC's initially, but did very well overall. STM to left pectorals, UT, lateral trunk with cocoa butter; multiple shortened areas noted in pecs and lateral trunk with mild tenderness.Educated pt how she can massage pectorals in stargazer position on Left. PROM left shoulder /Flex, abd, ER. Had pt try abd wall stretch but had shoulder pain so held. Pt given handout for breast MLD , and post op exercise sheet with stargazer stretch for HEP. Requested pt  try gfoam in bra at lateral trunk for swelling, and chip pack at medial fibrosis at night to see if it helps. Pt prefers limited visits.   10/21/23: MLD as follows in supine: short neck, R axillary nodes, L axillary nodes, establishment of interaxillary pathway, L breast moving fluid towards pathway and towards L axillary nodes (all nodes intact) and L UE working proximal to distal then retracing all steps moving fluid towards L axillary nodes and interaxillary pathway then to R sidelying to work on L posterior upper quadrant moving fluid towards L and R axillary nodes then back to supine retracing steps.  10/15/23: MLD as follows in supine: short neck, R axillary nodes, L axillary nodes, establishment of interaxillary pathway, L breast moving fluid towards pathway and towards L axillary nodes (all nodes intact) and L UE working proximal to distal then retracing all steps moving fluid towards L axillary nodes and interaxillary pathway. Educated pt throughout in anatomy and physiology of the lymphatic system and principles of MLD.  Demonstrated doorway and corner stretch and had pt return demonstrate - educated pt to hold stretch for 60 sec  10/08/23:  PROM to L shoulder to end range flexion and abduction while performing STM to L pec to decrease tightness; created foam chip pack for pt to wear in bra against lateral trunk to decrease swelling and also 1/2 inch grey foam in thick stockinette for pt to alternate with foam. Issued script for pt to obtain a compression bra from 2nd to [employer]    PATIENT EDUCATION:  Education details: MLD to L breast and UE Person educated: Patient Education method: Explanation Education comprehension: verbalized understanding  HOME EXERCISE PROGRAM: Obtain compression bra Wear foam or chip pack at L lateral trunk  Access Code: [email or web address] URL: [email or web address] Date: 10/31/2023 Prepared by: Leonette Most  Exercises - Supine  Upper-Thoracic Mobilization   - 1 x daily - 7 x weekly - 1 sets - 15-30 reps - Supine Thoracic Mobilization Foam Roll Vertical  - 1 x daily - 7 x weekly - 1 sets - 10 reps - Child's Pose with Sidebending  - 1 x daily - 7 x weekly - 1 sets - 2-3 reps - 1-2 min hold - Latissimus Mobilization on Foam Roll  - 1 x daily - 7 x weekly - 1 sets - 2 reps - 1-3 min hold - Tennis Ball Self Massage on Wall  - 1 x daily - 7 x weekly - 1 sets - 3-5 reps - 3-5 breaths or until muscles release hold ASSESSMENT:  CLINICAL IMPRESSION:  Pt attempted lat stretch over foam roll at home but was unable to tolerate lying on her L side over the foam roll. Numerous trigger points noted in serratus and lats today so focused session on STM to this area with great improvement noted in texture by end and pt able to tolerate sidelying over foam roller at end of session.    OBJECTIVE IMPAIRMENTS: decreased knowledge of condition, decreased knowledge of use of DME, decreased ROM, decreased strength, increased edema, increased fascial restrictions, impaired UE functional use, postural dysfunction, and pain.   ACTIVITY LIMITATIONS: carrying, lifting, and reach over head  PARTICIPATION LIMITATIONS:  none  PERSONAL FACTORS:  none  are also affecting patient's functional outcome.   REHAB POTENTIAL: Good  CLINICAL DECISION MAKING: Stable/uncomplicated  EVALUATION COMPLEXITY: Low  GOALS: Goals reviewed with patient? Yes  SHORT TERM GOALS=LONG TERM GOALS Target date: 11/05/23  Pt will be independent in self MLD for long term management of lymphedema. Baseline: Goal status: ONGOING  2.  Pt will obtain a compression bra for long term management of lymphedema. Baseline:  Goal status:MET 10/25/2023  3.  Pt will report a 75% improvement in pain and tightness in L upper quadrant to allow improved comfort. Baseline:  Goal status: ONGOING 25% improved  4.  Pt will be independent in a home exercise program for improving  posture and UE strength.  Baseline:  Goal status: INITIAL  5.  Pt will report at least a 25% improvement in L lateral trunk and breast swelling to decrease risk of infection.  Baseline:  Goal status: MET 10/31/23: 50% improved   PLAN:  PT FREQUENCY: 2x/week  PT DURATION: 4 weeks  PLANNED INTERVENTIONS: [id number]- PT Re-evaluation, 97110-Therapeutic exercises, 97530- Therapeutic activity, 97535- Self Care, [id number]- Manual therapy, 97760- Orthotic Fit/training, Patient/Family education, Joint mobilization, Therapeutic exercises, Therapeutic activity, and Self Care  PLAN FOR NEXT SESSION: plan to d/c, how did she feel after IASTM? Focus on STM to L pecs, Review MLD to L breast and lateral trunk with pt,, give supine scap in the next visit or so,  STM pectorals prn, Pt. May want limited visits.  [facility], PT 11/05/2023, 8:53 AM

## 2023-11-07 ENCOUNTER — Encounter: Payer: Self-pay | Admitting: Physical Therapy

## 2023-11-07 ENCOUNTER — Ambulatory Visit: Payer: Medicare Other | Admitting: Physical Therapy

## 2023-11-07 DIAGNOSIS — M25612 Stiffness of left shoulder, not elsewhere classified: Secondary | ICD-10-CM

## 2023-11-07 DIAGNOSIS — L599 Disorder of the skin and subcutaneous tissue related to radiation, unspecified: Secondary | ICD-10-CM | POA: Diagnosis not present

## 2023-11-07 DIAGNOSIS — R293 Abnormal posture: Secondary | ICD-10-CM | POA: Diagnosis not present

## 2023-11-07 DIAGNOSIS — I890 Lymphedema, not elsewhere classified: Secondary | ICD-10-CM | POA: Diagnosis not present

## 2023-11-07 DIAGNOSIS — D0512 Intraductal carcinoma in situ of left breast: Secondary | ICD-10-CM

## 2023-11-07 NOTE — Therapy (Signed)
OUTPATIENT PHYSICAL THERAPY  UPPER EXTREMITY ONCOLOGY TREATMENT  Patient Name: Sarah Pace MRN: [id number] DOB:03/21/1956, 67 y.o., female Today's Date: 11/07/2023  END OF SESSION:  PT End of Session - 11/07/23 1004     Visit Number 7    Number of Visits 9    Date for PT Re-Evaluation 11/05/23    PT Start Time 1003    PT Stop Time 1049    PT Time Calculation (min) 46 min    Activity Tolerance Patient tolerated treatment well    Behavior During Therapy [facility] for tasks assessed/performed              Past Medical History:  Diagnosis Date   Breast cancer (HCC) 04/19/2023   Left Breast   Past Surgical History:  Procedure Laterality Date   BREAST BIOPSY Left 03/07/2023   MM LT BREAST BX W LOC DEV 1ST LESION IMAGE BX SPEC STEREO GUIDE 03/07/2023 [facility]   BREAST BIOPSY  04/18/2023   MM LT RADIOACTIVE SEED LOC MAMMO GUIDE 04/18/2023 [facility]   BREAST BIOPSY Right 02/08/2023   At the Dermatologist office, negative.   BREAST LUMPECTOMY WITH RADIOACTIVE SEED LOCALIZATION Left 04/19/2023   Procedure: LEFT BREAST LUMPECTOMY WITH RADIOACTIVE SEED LOCALIZATION;  Surgeon: Chevis Pretty III, MD;  Location: [facility];  Service: General;  Laterality: Left;   Patient Active Problem List   Diagnosis Date Noted   Ductal carcinoma in situ (DCIS) of left breast 05/07/2023    PCP: Sigmund Hazel, MD  REFERRING PROVIDER: Loa Socks, NP  REFERRING DIAG:  [phone] (ICD-10-CM) - Ductal carcinoma in situ (DCIS) of left breast I89.0 (ICD-10-CM) - Lymphedema of breast  THERAPY DIAG:  Lymphedema, not elsewhere classified  Disorder of the skin and subcutaneous tissue related to radiation, unspecified  Stiffness of left shoulder, not elsewhere classified  Abnormal posture  Ductal carcinoma in situ of left breast  ONSET DATE: 08/04/23  Rationale for Evaluation and Treatment: Rehabilitation  SUBJECTIVE:                                                                                                                                                                                            SUBJECTIVE STATEMENT: I felt a lot better after last session.   PERTINENT HISTORY: 03/07/2023 -Left breast biopsy: Atypical ductal hyperplasia bordering on DCIS; 04/19/2023 - Left lumpectomy: Intermediate grade DCIS with necrosis 5 mm, margins negative, ER 90%, PR 30% 04/2023 - Tamoxifen x 5 years;  06/04/2023 - 07/01/2023 - pt completed radiation  PAIN:  Are you having pain? No  PRECAUTIONS: Other: L breast lymphedema  RED FLAGS: None  WEIGHT BEARING RESTRICTIONS: No  FALLS:  Has patient fallen in last 6 months? 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  COGNITION: Overall cognitive status: Within functional limits for tasks assessed   PALPATION: Increased fluid palpable especially in L lateral trunk and inferior breast  OBSERVATIONS / OTHER ASSESSMENTS: fullness in L breast but especially L lateral trunk and inferior axilla  POSTURE: forward head, rounded shoulders  UPPER EXTREMITY AROM/PROM:  A/PROM RIGHT   eval   Shoulder extension 72  Shoulder flexion 155  Shoulder abduction 157  Shoulder internal rotation 48 old injury  Shoulder external rotation 84    (Blank rows = not tested)  A/PROM LEFT   eval  Shoulder extension 75  Shoulder flexion 160  Shoulder abduction 159  Shoulder internal rotation  67  Shoulder external rotation 89    (Blank rows = not tested)  CERVICAL AROM: All within normal limits:    Percent limited  Flexion   Extension   Right lateral flexion   Left lateral flexion   Right rotation   Left rotation     UPPER EXTREMITY STRENGTH:   LYMPHEDEMA ASSESSMENTS:   SURGERY TYPE/DATE: 04/19/23  NUMBER OF LYMPH NODES REMOVED: 0  CHEMOTHERAPY: none  RADIATION:completed 07/01/23    LYMPHEDEMA ASSESSMENTS:   LANDMARK RIGHT  eval  At axilla  29.5  15 cm proximal to olecranon process 28  10 cm proximal to olecranon process 27  Olecranon process 23.6  15 cm proximal to ulnar styloid process 22.3  10 cm proximal to ulnar styloid process 18.7  Just proximal to ulnar styloid process 14.2  Across hand at thumb web space 18.5  At base of 2nd digit 5.8  (Blank rows = not tested)  LANDMARK LEFT  eval  At axilla  30.4  15 cm proximal to olecranon process 29  10 cm proximal to olecranon process 27.9  Olecranon process 23.9  15 cm proximal to ulnar styloid process 21.5  10 cm proximal to ulnar styloid process 18.6  Just proximal to ulnar styloid process 14.1  Across hand at thumb web space 18.5  At base of 2nd digit 5.5  (Blank rows = not tested)   QUICK DASH SURVEY:    BREAST COMPLAINTS SCALE: 47   TODAY'S TREATMENT:  DATE:  11/07/23: Pulleys x 2 min 30 sec in direction of flexion and 2 min 30 sec in direction of abduction Ball up wall x 10 reps in direction of flexion and 10 reps in direction of abduction IASTM using WAVE tool and cocoa butter to L serratus and lats in area of trigger points with multiple trigger points noted and ropey texture but improved by end of session  11/05/23: IASTM using WAVE tool and cocoa butter to L serratus and lats in area of trigger points with multiple trigger points noted and  ropey texture but improved by end of session Sidelying over foam roll on L side to see if pt is now able to tolerate after STM since she has been unable to home - did both sides with no difficulty noted  10/31/23: Assessed pt's progress towards goals  Focused on upper quadrant especially pec tightness today: Supine over foam roll with pt returning therapist demo for each: alternating flexion, bilateral scaption, snow angels - all x 10 each with v/c for correct form Marjo Bicker pose walking fingers to side to stretch L side x 30 sec holds Supine over small blue ball with knees bent and ball between shoulder blades with hands interlaced behind head: leaning backwards over ball alternating elbows together and elbows apart x 10 reps Instructed pt in self massage with tennis ball on wall and had pt return demo pec release - leaning against tennis ball and taking 5 deep breaths - breathing in to ball x 3 - then educated in using it in a stockinette to massage back R Sidelying of foam roller (roller just below axilla) raising LUE up and overhead while tilting body backwards and holds 5 sec to open chest x 5 10/25/2023 Educated pt in self MLD to the left breast utilizing bilateral axillary LN's.MLD as follows in supine: short neck, R axillary nodes, L axillary nodes, establishment of interaxillary pathway, L breast moving fluid towards pathway and towards L axillary nodes (all nodes intact) and R sidelying to work on L posterior upper quadrant moving fluid towards L and R axillary nodes then back to supine retracing steps Therapist performed steps first and pt repeated all steps. Therapist performed all work across the back, however, pt practiced some in SL to left axilla.  Pt required VC's and TC's initially, but did very well overall. STM to left pectorals, UT, lateral trunk with cocoa butter; multiple shortened areas noted in pecs and lateral trunk with mild tenderness.Educated pt how she can massage pectorals in  stargazer position on Left. PROM left shoulder /Flex, abd, ER. Had pt try abd wall stretch but had shoulder pain so held. Pt given handout for breast MLD , and post op exercise sheet with stargazer stretch for HEP. Requested pt try gfoam in bra at lateral trunk for swelling, and chip pack at medial fibrosis at night to see if it helps. Pt prefers limited visits.   10/21/23: MLD as follows in supine: short neck, R axillary nodes, L axillary nodes, establishment of interaxillary pathway, L breast moving fluid towards pathway and towards L axillary nodes (all nodes intact) and L UE working proximal to distal then retracing all steps moving fluid towards L axillary nodes and interaxillary pathway then to R sidelying to work on L posterior upper quadrant moving fluid towards L and R axillary nodes then back to supine retracing steps.  10/15/23: MLD as follows in supine: short neck, R axillary nodes, L axillary nodes, establishment of interaxillary pathway, L breast  moving fluid towards pathway and towards L axillary nodes (all nodes intact) and L UE working proximal to distal then retracing all steps moving fluid towards L axillary nodes and interaxillary pathway. Educated pt throughout in anatomy and physiology of the lymphatic system and principles of MLD.  Demonstrated doorway and corner stretch and had pt return demonstrate - educated pt to hold stretch for 60 sec  10/08/23: PROM to L shoulder to end range flexion and abduction while performing STM to L pec to decrease tightness; created foam chip pack for pt to wear in bra against lateral trunk to decrease swelling and also 1/2 inch grey foam in thick stockinette for pt to alternate with foam. Issued script for pt to obtain a compression bra from 2nd to [employer]    PATIENT EDUCATION:  Education details: MLD to L breast and UE Person educated: Patient Education method: Explanation Education comprehension: verbalized understanding  HOME EXERCISE  PROGRAM: Obtain compression bra Wear foam or chip pack at L lateral trunk  Access Code: [email or web address] URL: [email or web address] Date: 10/31/2023 Prepared by: Leonette Most  Exercises - Supine Upper-Thoracic Mobilization   - 1 x daily - 7 x weekly - 1 sets - 15-30 reps - Supine Thoracic Mobilization Foam Roll Vertical  - 1 x daily - 7 x weekly - 1 sets - 10 reps - Child's Pose with Sidebending  - 1 x daily - 7 x weekly - 1 sets - 2-3 reps - 1-2 min hold - Latissimus Mobilization on Foam Roll  - 1 x daily - 7 x weekly - 1 sets - 2 reps - 1-3 min hold - Tennis Ball Self Massage on Wall  - 1 x daily - 7 x weekly - 1 sets - 3-5 reps - 3-5 breaths or until muscles release hold ASSESSMENT:  CLINICAL IMPRESSION:  Assessed pt's progress towards goals in therapy. She has met all goals and has partially met her tightness goal. She reports she is ready for discharge and is able to independently manage her tightness and swelling at home. Continue with STM to L lateral trunk today to decrease muscle tightness and improve discomfort. Pt will be discharged from skilled PT services at this time.   OBJECTIVE IMPAIRMENTS: decreased knowledge of condition, decreased knowledge of use of DME, decreased ROM, decreased strength, increased edema, increased fascial restrictions, impaired UE functional use, postural dysfunction, and pain.   ACTIVITY LIMITATIONS: carrying, lifting, and reach over head  PARTICIPATION LIMITATIONS:  none  PERSONAL FACTORS:  none  are also affecting patient's functional outcome.   REHAB POTENTIAL: Good  CLINICAL DECISION MAKING: Stable/uncomplicated  EVALUATION COMPLEXITY: Low  GOALS: Goals reviewed with patient? Yes  SHORT TERM GOALS=LONG TERM GOALS Target date: 11/05/23  Pt will be independent in self MLD for long term management of lymphedema. Baseline: Goal status: MET 11/07/23  2.  Pt will obtain a compression bra for long term management of  lymphedema. Baseline:  Goal status:MET 10/25/2023  3.  Pt will report a 75% improvement in pain and tightness in L upper quadrant to allow improved comfort. Baseline:  Goal status: PARTIALLY MET 60% improved  4.  Pt will be independent in a home exercise program for improving posture and UE strength.  Baseline:  Goal status: MET 11/07/23  5.  Pt will report at least a 25% improvement in L lateral trunk and breast swelling to decrease risk of infection.  Baseline:  Goal status: MET 10/31/23: 50% improved   PLAN:  PT FREQUENCY: 2x/week  PT DURATION: 4 weeks  PLANNED INTERVENTIONS: 97164- PT Re-evaluation, 97110-Therapeutic exercises, 97530- Therapeutic activity, 97535- Self Care, 53664- Manual therapy, [phone]- Orthotic Fit/training, Patient/Family education, Joint mobilization, Therapeutic exercises, Therapeutic activity, and Self Care  PLAN FOR NEXT SESSION: d/c this visit  Leonette Most, PT 11/07/2023, 10:52 AM  PHYSICAL THERAPY DISCHARGE SUMMARY  Visits from Start of Care: 7  Current functional level related to goals / functional outcomes: All goals met and 1 partially met   Remaining deficits: Still some tightness remaining in L UQ but pt reports she is able to manage at home    Education / Equipment: HEP, self MLD   Patient agrees to discharge. Patient goals were partially met. Patient is being discharged due to being pleased with the current functional level.  Milagros Loll [place], [place] 11/07/23 10:55 AM

## 2023-11-08 DIAGNOSIS — D0512 Intraductal carcinoma in situ of left breast: Secondary | ICD-10-CM | POA: Diagnosis not present

## 2024-01-31 ENCOUNTER — Ambulatory Visit
Admission: RE | Admit: 2024-01-31 | Discharge: 2024-01-31 | Disposition: A | Discharge status: Home / Self Care | Payer: Medicare Other | Source: Ambulatory Visit | Attending: Adult Health | Admitting: Adult Health

## 2024-01-31 DIAGNOSIS — Z853 Personal history of malignant neoplasm of breast: Secondary | ICD-10-CM | POA: Diagnosis not present

## 2024-01-31 DIAGNOSIS — D0512 Intraductal carcinoma in situ of left breast: Secondary | ICD-10-CM

## 2024-03-20 DIAGNOSIS — H401131 Primary open-angle glaucoma, bilateral, mild stage: Secondary | ICD-10-CM | POA: Diagnosis not present

## 2024-04-03 ENCOUNTER — Inpatient Hospital Stay: Payer: Medicare Other | Attending: Adult Health | Admitting: Adult Health

## 2024-04-03 ENCOUNTER — Encounter: Payer: Self-pay | Admitting: Adult Health

## 2024-04-03 VITALS — BP 135/76 | HR 60 | Temp 98.0°F | Resp 15 | Wt 163.6 lb

## 2024-04-03 DIAGNOSIS — Z1721 Progesterone receptor positive status: Secondary | ICD-10-CM | POA: Insufficient documentation

## 2024-04-03 DIAGNOSIS — Z170 Estrogen receptor positive status [ER+]: Secondary | ICD-10-CM | POA: Insufficient documentation

## 2024-04-03 DIAGNOSIS — D0512 Intraductal carcinoma in situ of left breast: Secondary | ICD-10-CM | POA: Insufficient documentation

## 2024-04-03 NOTE — Assessment & Plan Note (Signed)
 Sarah Pace is a 68 year old woman with stage 0 DCIS, ER/PR positive, diagnosed in 04/2023 s/p lumpectomy and radiation, opted to forego antiestrogen therapy.    Stage 0 breast cancer: she has no clinical or radiographic signs of breast cancer recurrence.  She will continue on observation alone with annual mammograms.  Next due 01/2025. Health maintenance: recommended healthy diet and exercise and continued f/u with her PCP regularly.    RTC in 1 year for follow-up.

## 2024-04-03 NOTE — Progress Notes (Signed)
 [name]:    [name], MD [address] [place] [address]   DIAGNOSIS:  Cancer Staging  Ductal carcinoma in situ (DCIS) of left breast Staging form: Breast, AJCC 8th Edition - Pathologic stage from 04/19/2023: Stage 0 (pTis (DCIS), pN0, cM0) - Signed by Percival Brace, NP on 10/02/2023 Stage prefix: Initial diagnosis - Clinical: Stage 0 (cTis (DCIS), cN0, cM0, G2, ER+, PR+, HER2: Not Assessed) - Signed by Cameron Cea, MD on 05/07/2023 Stage prefix: Initial diagnosis Nuclear grade: G2 Histologic grading system: 3 grade system    SUMMARY OF ONCOLOGIC HISTORY: Oncology History  Ductal carcinoma in situ (DCIS) of left breast  03/07/2023 Initial Diagnosis   Left breast biopsy: Atypical ductal hyperplasia bordering on DCIS   04/19/2023 Surgery   Left lumpectomy: Intermediate grade DCIS with necrosis 5 mm, margins negative, ER 90%, PR 30%   04/19/2023 Cancer Staging   Staging form: Breast, AJCC 8th Edition - Pathologic stage from 04/19/2023: Stage 0 (pTis (DCIS), pN0, cM0) - Signed by Percival Brace, NP on 10/02/2023 Stage prefix: Initial diagnosis   05/07/2023 Cancer Staging   Staging form: Breast, AJCC 8th Edition - Clinical: Stage 0 (cTis (DCIS), cN0, cM0, G2, ER+, PR+, HER2: Not Assessed) - Signed by Cameron Cea, MD on 05/07/2023 Stage prefix: Initial diagnosis Nuclear grade: G2 Histologic grading system: 3 grade system   04/2023 -  Anti-estrogen oral therapy   Tamoxifen x 5 years--patient opted to forego AET   06/04/2023 - 07/01/2023 Radiation Therapy   Plan Name: Breast_L_BH Site: Breast, Left Technique: 3D Mode: Photon Dose Per Fraction: 2.66 Gy Prescribed Dose (Delivered / Prescribed): 42.56 Gy / 42.56 Gy Prescribed Fxs (Delivered / Prescribed): 16 / 16   Plan Name: Brst_L_Bst_BH Site: Breast, Left Technique: 3D Mode: Photon Dose Per Fraction: 2 Gy Prescribed Dose (Delivered / Prescribed): 8 Gy / 8  Gy Prescribed Fxs (Delivered / Prescribed): 4 / 4     CURRENT THERAPY: observation  INTERVAL HISTORY:  Sarah Pace 68 y.o. female returns for follow-up of her history of breast cancer.  Her most recent mammogram occurred on January 31, 2024 demonstrating no mammographic evidence of malignancy and breast density category B.  She is exercising and denies any new concerns or health changes this past year.     Patient Active Problem List   Diagnosis Date Noted   Ductal carcinoma in situ (DCIS) of left breast 05/07/2023    is allergic to iodine, sulfa antibiotics, and wound dressing adhesive.  MEDICAL HISTORY: Past Medical History:  Diagnosis Date   Breast cancer (HCC) 04/19/2023   Left Breast    SURGICAL HISTORY: Past Surgical History:  Procedure Laterality Date   BREAST BIOPSY Left 03/07/2023   MM LT BREAST BX W LOC DEV 1ST LESION IMAGE BX SPEC STEREO GUIDE 03/07/2023 [facility]   BREAST BIOPSY  04/18/2023   MM LT RADIOACTIVE SEED LOC MAMMO GUIDE 04/18/2023 [facility]   BREAST BIOPSY Right 02/08/2023   At the Dermatologist office, negative.   BREAST LUMPECTOMY WITH RADIOACTIVE SEED LOCALIZATION Left 04/19/2023   Procedure: LEFT BREAST LUMPECTOMY WITH RADIOACTIVE SEED LOCALIZATION;  Surgeon: Caralyn Chandler, MD;  Location: [facility];  Service: General;  Laterality: Left;    SOCIAL HISTORY: Social History   Socioeconomic History   Marital status: Married    Spouse name: Not on file   Number of children: Not on file   Years of education: Not on file  Highest education level: Not on file  Occupational History   Not on file  Tobacco Use   Smoking status: Never   Smokeless tobacco: Never  Substance and Sexual Activity   Alcohol use: No    Alcohol/week: 0.0 standard drinks of alcohol   Drug use: No   Sexual activity: Not on file  Other Topics Concern   Not on file  Social History Narrative   Not on file   Social Drivers of Health    Financial Resource Strain: Not on file  Food Insecurity: Not on file  Transportation Needs: Not on file  Physical Activity: Not on file  Stress: Not on file  Social Connections: Not on file  Intimate Partner Violence: Not on file    FAMILY HISTORY: Family History  Problem Relation Age of Onset   Colon cancer Father 28   Breast cancer Neg Hx     Review of Systems  Constitutional:  Negative for appetite change, chills, fatigue, fever and unexpected weight change.  HENT:   Negative for hearing loss, lump/mass and trouble swallowing.   Eyes:  Negative for eye problems and icterus.  Respiratory:  Negative for chest tightness, cough and shortness of breath.   Cardiovascular:  Negative for chest pain, leg swelling and palpitations.  Gastrointestinal:  Negative for abdominal distention, abdominal pain, constipation, diarrhea, nausea and vomiting.  Endocrine: Negative for hot flashes.  Genitourinary:  Negative for difficulty urinating.   Musculoskeletal:  Negative for arthralgias.  Skin:  Negative for itching and rash.  Neurological:  Negative for dizziness, extremity weakness, headaches and numbness.  Hematological:  Negative for adenopathy. Does not bruise/bleed easily.  Psychiatric/Behavioral:  Negative for depression. The patient is not nervous/anxious.       PHYSICAL EXAMINATION    Vitals:   04/03/24 1126  BP: 135/76  Pulse: 60  Resp: 15  Temp: 98 F (36.7 C)  SpO2: 97%    Physical Exam Constitutional:      General: She is not in acute distress.    Appearance: Normal appearance. She is not toxic-appearing.  HENT:     Head: Normocephalic and atraumatic.     Mouth/Throat:     Mouth: Mucous membranes are moist.     Pharynx: Oropharynx is clear. No oropharyngeal exudate or posterior oropharyngeal erythema.  Eyes:     General: No scleral icterus. Cardiovascular:     Rate and Rhythm: Normal rate and regular rhythm.     Pulses: Normal pulses.     Heart sounds:  Normal heart sounds.  Pulmonary:     Effort: Pulmonary effort is normal.     Breath sounds: Normal breath sounds.  Abdominal:     General: Abdomen is flat. Bowel sounds are normal. There is no distension.     Palpations: Abdomen is soft.     Tenderness: There is no abdominal tenderness.  Musculoskeletal:        General: No swelling.     Cervical back: Neck supple.  Lymphadenopathy:     Cervical: No cervical adenopathy.  Skin:    General: Skin is warm and dry.     Findings: No rash.  Neurological:     General: No focal deficit present.     Mental Status: She is alert.  Psychiatric:        Mood and Affect: Mood normal.        Behavior: Behavior normal.      ASSESSMENT and THERAPY PLAN:   Ductal carcinoma in situ (DCIS) of left  breast Sarah Pace is a 68 year old woman with stage 0 DCIS, ER/PR positive, diagnosed in 04/2023 s/p lumpectomy and radiation, opted to forego antiestrogen therapy.    Stage 0 breast cancer: she has no clinical or radiographic signs of breast cancer recurrence.  She will continue on observation alone with annual mammograms.  Next due 01/2025. Health maintenance: recommended healthy diet and exercise and continued f/u with her PCP regularly.    RTC in 1 year for follow-up.    All questions were answered. The patient knows to call the clinic with any problems, questions or concerns. We can certainly see the patient much sooner if necessary.  Total encounter time:30 minutes*in face-to-face visit time, chart review, lab review, care coordination, order entry, and documentation of the encounter time.    Alwin Baars, NP 04/03/24 3:25 PM Medical Oncology and Hematology [facility] [address] [place], [place] [address] Tel. [phone]    Fax. [phone]  *Total Encounter Time as defined by the Centers for Medicare and Medicaid Services includes, in addition to the face-to-face time of a patient visit (documented in the note above)  non-face-to-face time: obtaining and reviewing outside history, ordering and reviewing medications, tests or procedures, care coordination (communications with other health care professionals or caregivers) and documentation in the medical record.

## 2024-04-30 DIAGNOSIS — I972 Postmastectomy lymphedema syndrome: Secondary | ICD-10-CM | POA: Diagnosis not present

## 2024-06-02 DIAGNOSIS — H527 Unspecified disorder of refraction: Secondary | ICD-10-CM | POA: Diagnosis not present

## 2024-06-05 ENCOUNTER — Other Ambulatory Visit: Payer: Medicare Other

## 2024-07-24 DIAGNOSIS — Z853 Personal history of malignant neoplasm of breast: Secondary | ICD-10-CM | POA: Diagnosis not present

## 2024-07-24 DIAGNOSIS — R5383 Other fatigue: Secondary | ICD-10-CM | POA: Diagnosis not present

## 2024-07-24 DIAGNOSIS — Z23 Encounter for immunization: Secondary | ICD-10-CM | POA: Diagnosis not present

## 2024-07-24 DIAGNOSIS — Z0000 Encounter for general adult medical examination without abnormal findings: Secondary | ICD-10-CM | POA: Diagnosis not present

## 2024-07-24 DIAGNOSIS — Z780 Asymptomatic menopausal state: Secondary | ICD-10-CM | POA: Diagnosis not present

## 2024-07-24 DIAGNOSIS — Z136 Encounter for screening for cardiovascular disorders: Secondary | ICD-10-CM | POA: Diagnosis not present

## 2024-07-24 DIAGNOSIS — M2550 Pain in unspecified joint: Secondary | ICD-10-CM | POA: Diagnosis not present

## 2024-07-24 DIAGNOSIS — H409 Unspecified glaucoma: Secondary | ICD-10-CM | POA: Diagnosis not present

## 2024-07-24 DIAGNOSIS — J4520 Mild intermittent asthma, uncomplicated: Secondary | ICD-10-CM | POA: Diagnosis not present

## 2024-07-24 DIAGNOSIS — R7303 Prediabetes: Secondary | ICD-10-CM | POA: Diagnosis not present

## 2024-08-21 DIAGNOSIS — D122 Benign neoplasm of ascending colon: Secondary | ICD-10-CM | POA: Diagnosis not present

## 2024-08-21 DIAGNOSIS — K5730 Diverticulosis of large intestine without perforation or abscess without bleeding: Secondary | ICD-10-CM | POA: Diagnosis not present

## 2024-08-21 DIAGNOSIS — K648 Other hemorrhoids: Secondary | ICD-10-CM | POA: Diagnosis not present

## 2024-08-21 DIAGNOSIS — Z1211 Encounter for screening for malignant neoplasm of colon: Secondary | ICD-10-CM | POA: Diagnosis not present

## 2024-08-21 DIAGNOSIS — D120 Benign neoplasm of cecum: Secondary | ICD-10-CM | POA: Diagnosis not present

## 2024-08-21 DIAGNOSIS — Z800 Family history of malignant neoplasm of digestive organs: Secondary | ICD-10-CM | POA: Diagnosis not present

## 2024-08-21 DIAGNOSIS — K6289 Other specified diseases of anus and rectum: Secondary | ICD-10-CM | POA: Diagnosis not present

## 2024-08-25 DIAGNOSIS — M85851 Other specified disorders of bone density and structure, right thigh: Secondary | ICD-10-CM | POA: Diagnosis not present

## 2025-04-06 ENCOUNTER — Ambulatory Visit: Admitting: Hematology and Oncology
